# Patient Record
Sex: Male | Born: 1990 | Race: White | Hispanic: No | Marital: Married | Smoking: Never smoker
Health system: Southern US, Community
[De-identification: ages and names within clinical notes are randomized; demographics above are authoritative.]

## PROBLEM LIST (undated history)

## (undated) DIAGNOSIS — K5090 Crohn's disease, unspecified, without complications: Secondary | ICD-10-CM

---
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
## 2019-05-15 ENCOUNTER — Emergency Department
Admission: EM | Admit: 2019-05-15 | Discharge: 2019-05-15 | Disposition: A | Discharge status: Home / Self Care | Payer: Self-pay | Source: Home / Self Care | Attending: Family Medicine | Admitting: Family Medicine

## 2019-05-15 ENCOUNTER — Other Ambulatory Visit: Payer: Self-pay

## 2019-05-15 DIAGNOSIS — Z87820 Personal history of traumatic brain injury: Secondary | ICD-10-CM

## 2019-05-15 DIAGNOSIS — J3089 Other allergic rhinitis: Secondary | ICD-10-CM

## 2019-05-15 HISTORY — DX: Crohn's disease, unspecified, without complications: K50.90

## 2019-05-15 MED ORDER — PREDNISONE 20 MG PO TABS: ORAL_TABLET | ORAL | 0 refills | Status: DC

## 2019-05-15 NOTE — Discharge Instructions (Signed)
Try Pseudoephedrine (61m, one or two every 4 to 6 hours) for sinus congestion.  Get adequate rest.   May use Afrin nasal spray (or generic oxymetazoline) each morning for about 5 days and then discontinue.  Also recommend using saline nasal spray several times daily and saline nasal irrigation (AYR is a common brand).  Use Flonase nasal spray each morning after using Afrin nasal spray and saline nasal irrigation.

## 2019-05-15 NOTE — ED Triage Notes (Signed)
Dec 10th at work, a Designer, television/film set swung shut and hit in the head.  Next day had sinus issues, facial pain.  Used clean water, did not boil water.  Sat and Sun, rested felt ok.   Pt has had a ringing in the ear for several years, the ringing is worse.  Pt has had some vertigo around the 18th.  The 18th, felt "like his head was in the clouds".  Took mucinex dm, and had diarrhea.  Did a virtual visit, and was told to go to the ER.  Wed, 23rd, went to the ER, and PA felt that he did not have a concussion.  Had a flu and COVID, but both came back negative on the 25th.  25th and 26th felt better.  Yesterday played paintball, and felt good.  Last night had pressure in head, temples and forehead.

## 2019-05-15 NOTE — ED Provider Notes (Signed)
[facility]    CSN: [id number] Arrival date & time: 05/15/19  1249      History   Chief Complaint Chief Complaint  Patient presents with  . Dizziness    HPI Phillip Mcclain is a 28 y.o. male.   18 days ago a car door his patient's head.  He denies loss of consciousness, laceration and hematoma.  The next day he had increased sinus congestion and facial pain.  He has a history of tinnitus which has become worse.  About 2 weeks ago after playing paintball, he felt like his head was "in the clouds."  He was evaluated in the ER 5 days ago with a benign exam.  There flu and COVID19 tests were negative.  Yesterday he played paintball again.  Last night he felt pressure-like sensation in his head, temples, and forehead. He has a history of perennial rhinitis and chronic nasal congestion.  The history is provided by the patient.    Past Medical History:  Diagnosis Date  . Crohn's disease ([place])     There are no problems to display for this patient.   History reviewed. No pertinent surgical history.     Home Medications    Prior to Admission medications   Medication Sig Start Date End Date Taking? Authorizing Provider  Adalimumab 40 MG/0.8ML PNKT INJECT 40MG SUBCUTANEOUSLY  EVERY 2 WEEKS 06/02/18  Yes [provider]  predniSONE (DELTASONE) 20 MG tablet Take one tab by mouth twice daily for 4 days, then one daily. Take with food. 05/15/19   Kandra Nicolas, MD    Family History Family History  Problem Relation Age of Onset  . Hypertension Father     Social History Social History   Tobacco Use  . Smoking status: Never Smoker  . Smokeless tobacco: Never Used  Substance Use Topics  . Alcohol use: Not Currently  . Drug use: Not Currently     Allergies   Penicillins   Review of Systems Review of Systems  Constitutional: Negative for activity change, appetite change, chills, diaphoresis, fatigue and fever.  HENT: Positive for congestion, sinus  pressure and tinnitus. Negative for ear discharge, ear pain, facial swelling, hearing loss and sore throat.   Eyes: Negative.   Respiratory: Negative.   Cardiovascular: Negative.   Gastrointestinal: Negative.   Genitourinary: Negative.   Musculoskeletal: Negative.   Neurological: Negative for dizziness, tremors, seizures, syncope, facial asymmetry, speech difficulty, weakness, light-headedness, numbness and headaches.     Physical Exam Triage Vital Signs ED Triage Vitals  Enc Vitals Group     BP 05/15/19 1325 (!) 137/93     Pulse Rate 05/15/19 1325 95     Resp 05/15/19 1325 20     Temp 05/15/19 1325 98.5 F (36.9 C)     Temp Source 05/15/19 1325 Oral     SpO2 05/15/19 1325 100 %     Weight 05/15/19 1326 108 lb (49 kg)     Height 05/15/19 1326 5' 5"  (1.651 m)     Head Circumference --      Peak Flow --      Pain Score 05/15/19 1326 0     Pain Loc --      Pain Edu? --      Excl. in [place]? --    No data found.  Updated Vital Signs BP (!) 137/93 (BP Location: Right Arm)   Pulse 95   Temp 98.5 F (36.9 C) (Oral)   Resp 20   Ht  5' 5"  (1.651 m)   Wt 49 kg   SpO2 100%   BMI 17.97 kg/m   Visual Acuity Right Eye Distance:   Left Eye Distance:   Bilateral Distance:    Right Eye Near:   Left Eye Near:    Bilateral Near:     Physical Exam Nursing notes and Vital Signs reviewed. Appearance:  Patient appears stated age, and in no acute distress Eyes:  Pupils are equal, round, and reactive to light and accomodation.  Extraocular movement is intact.  Conjunctivae are not inflamed.  Fundi benign.  Ears:  Canals normal.  Tympanic membranes normal.  Nose:  Congested turbinates.  No sinus tenderness.   Pharynx:  Normal Neck:  Supple.  Lungs:  Clear to auscultation.  Breath sounds are equal.  Moving air well. Heart:  Regular rate and rhythm without murmurs, rubs, or gallops.  Abdomen:  Nontender without masses or hepatosplenomegaly.  Bowel sounds are present.  No CVA or flank  tenderness.  Extremities:  No edema.  Skin:  No rash present.  Neurologic:  Cranial nerves 2 through 12 are normal.  Patellar, achilles, and elbow reflexes are normal.  Cerebellar function is intact (finger-to-nose and rapid alternating hand movement).  Gait and station are normal.  Grip strength symmetric bilaterally.  Romberg negative.   UC Treatments / Results  Labs (all labs ordered are listed, but only abnormal results are displayed) Labs Reviewed - No data to display  EKG   Radiology No results found.  Procedures Procedures (including critical care time)  Medications Ordered in UC Medications - No data to display  Initial Impression / Assessment and Plan / UC Course  I have reviewed the triage vital signs and the nursing notes.  Pertinent labs & imaging results that were available during my care of the patient were reviewed by me and considered in my medical decision making (see chart for details).    Suspect mild post-concussion syndrome.  Normal neurologic exam reassuring. For sinus congestion, begin prednisone burst/taper. Followup with ENT if not improving.   Final Clinical Impressions(s) / UC Diagnoses   Final diagnoses:  History of closed head injury  Perennial allergic rhinitis     Discharge Instructions     Try Pseudoephedrine (46m, one or two every 4 to 6 hours) for sinus congestion.  Get adequate rest.   May use Afrin nasal spray (or generic oxymetazoline) each morning for about 5 days and then discontinue.  Also recommend using saline nasal spray several times daily and saline nasal irrigation (AYR is a common brand).  Use Flonase nasal spray each morning after using Afrin nasal spray and saline nasal irrigation.      ED Prescriptions    Medication Sig Dispense Auth. Provider   predniSONE (DELTASONE) 20 MG tablet Take one tab by mouth twice daily for 4 days, then one daily. Take with food. 12 tablet BKandra Nicolas MD        BKandra Nicolas  MD 05/19/19 [id number]

## 2019-05-30 ENCOUNTER — Other Ambulatory Visit: Payer: Self-pay

## 2019-05-31 ENCOUNTER — Encounter: Payer: Self-pay | Admitting: Family Medicine

## 2019-05-31 ENCOUNTER — Ambulatory Visit (INDEPENDENT_AMBULATORY_CARE_PROVIDER_SITE_OTHER): Payer: Self-pay | Admitting: Family Medicine

## 2019-05-31 ENCOUNTER — Ambulatory Visit: Payer: Self-pay | Admitting: Family Medicine

## 2019-05-31 ENCOUNTER — Other Ambulatory Visit: Payer: Self-pay

## 2019-05-31 VITALS — BP 118/80 | HR 56 | Temp 97.2°F | Ht 65.0 in | Wt 105.5 lb

## 2019-05-31 DIAGNOSIS — S0990XA Unspecified injury of head, initial encounter: Secondary | ICD-10-CM

## 2019-05-31 DIAGNOSIS — R42 Dizziness and giddiness: Secondary | ICD-10-CM

## 2019-05-31 DIAGNOSIS — H6982 Other specified disorders of Eustachian tube, left ear: Secondary | ICD-10-CM

## 2019-05-31 NOTE — Patient Instructions (Signed)
Fish oil 3 grams daily for 10 days then 2 grams daily  Vitamin D 4000 IU daily  CoQ10 228m daily for headaches  Tart cherry extract any dose at night  Keep the diet clean and stay active.  Send me a message in 1-2 days if you would like to see a neurologist.   I think you will feel quite a bit better after starting a nasal spray.  Claritin (loratadine), Allegra (fexofenadine), Zyrtec (cetirizine) which is also equivalent to Xyzal (levocetirizine); these are listed in order from weakest to strongest. Generic, and therefore cheaper, options are in the parentheses.   Flonase (fluticasone); nasal spray that is over the counter. 2 sprays each nostril, once daily. Aim towards the same side eye when you spray.  There are available OTC, and the generic versions, which may be cheaper, are in parentheses. Show this to a pharmacist if you have trouble finding any of these items.  Let [place] if you need anything.

## 2019-05-31 NOTE — Progress Notes (Signed)
Chief Complaint  Patient presents with  . New Patient (Initial Visit)    04/27/19 hit in head by car door possible a concusion       New Patient Visit SUBJECTIVE: HPI: Phillip Mcclain is an 29 y.o.male who is being seen for establishing care.  The patient has not had PCP.  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  On 12/10, he got hit in back of head w a truck door.  It was not particularly hard, did not have LOC.  Since that time, the ringing in his left ear has gotten louder.  He will have some fullness on that side as well.  He has had some sinus issues after this happened initially.  He has been doing sinus rinses but no other medications.  He was seen and turned away by several urgent cares before having a virtual visit and being told he needs to go to the emergency department.  Work-up was unremarkable and he was discharged home.  He had another episode of vertigo and went to the ED 5 days later.  Visit was unremarkable, tested negative for both flu and Covid.  Symptoms are overall improved but he still having pressure over his temples, the ear issue, neck tightness, and intermittent vertigo (lasting around several minutes).  It does not calm when he is physically active or concentrating but usually after.  He did try the Epley maneuver which was not particularly helpful.  Past Medical History:  Diagnosis Date  . Crohn's disease ([place])    History reviewed. No pertinent surgical history.   Family History  Problem Relation Age of Onset  . Hypertension Father    Allergies  Allergen Reactions  . Penicillins     Current Outpatient Medications:  .  Adalimumab 40 MG/0.8ML PNKT, INJECT 40MG SUBCUTANEOUSLY  EVERY 2 WEEKS, Disp: , Rfl:   ROS MSK: +neck tightness Neuro: +intermittent vertigo  OBJECTIVE: BP 118/80 (BP Location: Left Arm, Patient Position: Sitting, Cuff Size: Normal)    Pulse (!) 56   Temp (!) 97.2 F (36.2 C) (Temporal)   Ht 5' 5"  (1.651 m)   Wt 105 lb 8 oz (47.9 kg)   SpO2 95%   BMI 17.56 kg/m  General:  well developed, well nourished, in no apparent distress Skin:  no significant moles, warts, or growths Nose:  nares patent, septum midline, mucosa normal Ears: Canals are patent bilaterally without lesions; TMs negative on the right, retracted on the left without fluid or erythema. Throat/Pharynx:  lips and gingiva without lesion; tongue and uvula midline; non-inflamed pharynx; no exudates, +postnasal drainage Lungs:  clear to auscultation, breath sounds equal bilaterally, no respiratory distress Cardio:  regular rate and rhythm, no LE edema or bruits Musculoskeletal:  symmetrical muscle groups noted without atrophy or deformity Neuro:  gait normal, DTRs equal and symmetric throughout, no clonus, no cerebellar signs Psych: well oriented with normal range of affect and appropriate judgment/insight  ASSESSMENT/PLAN: Dysfunction of left eustachian tube  Vertigo  Injury of head, initial encounter  1-start INCS and PO antihistamine.  He reports he is very sensitive to medication so we will hold off on prednisone.  He has an appointment with the ENT team tomorrow. 2-I think it could be related to his ear issue and if the ETD is fixed, I think his vertigo will be as well. 3-while I think this could be related to a postconcussive  symptom, it is not fit classic signs and symptoms, particular exacerbation with concentration or physical exertion.  I did give him some over-the-counter supplements such as vitamin D, coenzyme Q 10, cherry tart extract, and fish oil to take.  The downside of these is low. Offered to refer to Neuro if he is not pleased w ENT eval and is not improving w INCS and supplements.  Follow-up in 6 months for physical or as needed. The patient voiced understanding and agreement to the plan.   Nixon, DO 05/31/19  11:12  AM

## 2019-06-07 ENCOUNTER — Ambulatory Visit: Payer: Self-pay | Admitting: Family Medicine

## 2019-06-07 ENCOUNTER — Encounter: Payer: Self-pay | Admitting: Family Medicine

## 2019-06-07 ENCOUNTER — Other Ambulatory Visit: Payer: Self-pay | Admitting: Family Medicine

## 2019-06-07 DIAGNOSIS — S060X0D Concussion without loss of consciousness, subsequent encounter: Secondary | ICD-10-CM

## 2019-06-07 DIAGNOSIS — F0781 Postconcussional syndrome: Secondary | ICD-10-CM

## 2019-06-07 NOTE — Progress Notes (Signed)
am

## 2019-07-04 ENCOUNTER — Other Ambulatory Visit: Payer: Self-pay

## 2019-07-05 ENCOUNTER — Telehealth: Payer: Self-pay

## 2019-07-05 ENCOUNTER — Encounter: Payer: Self-pay | Admitting: Family Medicine

## 2019-07-05 ENCOUNTER — Ambulatory Visit (INDEPENDENT_AMBULATORY_CARE_PROVIDER_SITE_OTHER): Payer: 59 | Admitting: Family Medicine

## 2019-07-05 VITALS — BP 108/62 | HR 102 | Temp 96.7°F | Ht 65.0 in | Wt 111.4 lb

## 2019-07-05 DIAGNOSIS — R42 Dizziness and giddiness: Secondary | ICD-10-CM

## 2019-07-05 DIAGNOSIS — S0990XD Unspecified injury of head, subsequent encounter: Secondary | ICD-10-CM | POA: Diagnosis not present

## 2019-07-05 NOTE — Telephone Encounter (Signed)
Patient was hit in head by a car door on 04/26/2020. No LOC. One other head injury as a child. Patient has been having pressure in his head, dizziness, and tinnitus intermittently with no pattern to any of the symptoms. Also feels anxious when he gets these symptoms. 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 Work does not provoke symptoms. Also plays competitive paintball. No symptoms with activity but develops symptoms afterwards. Is seeing neurology next week but would also like to be seen in our clinic.

## 2019-07-05 NOTE — Progress Notes (Signed)
Chief Complaint  Patient presents with  . Concussion  . Blood Sugar Problem    Subjective: Patient is a 29 y.o. male here for f/u.  The patient sustained a head injury several months ago.  He thought he had a concussion and is having issues with postconcussive syndrome.  He has an appointment with the neurology team next week.  He has been taking supplements such as fish oil, vitamin D, coenzyme Q 10, and chart cherry extract with minimal improvement.  He has been having vague neurologic complaints.  He saw a different neurologist 1 week ago and was told to drink more water.  He is currently drinking 60-65 ounces daily.  Since that time, he is also felt more lightheadedness/dizziness the further he gets from meals.  This would happen to a lesser extent when he was younger.  He does have a family history of diabetes but no history of high insulin or frequent low blood sugars.  He takes no medications routinely outside of supplements.  His diet remains largely unchanged.   ROS: Endo: No wt loss Cardiac: +lightheadedness  Past Medical History:  Diagnosis Date  . Crohn's disease ([place])     Objective: BP 108/62 (BP Location: Left Arm, Patient Position: Sitting, Cuff Size: Normal)   Pulse (!) 102   Temp (!) 96.7 F (35.9 C) (Temporal)   Ht 5' 5"  (1.651 m)   Wt 111 lb 6 oz (50.5 kg)   SpO2 94%   BMI 18.53 kg/m  General: Awake, appears stated age HEENT: MMM, EOMi Heart: RRR, no murmurs, no bruits Lungs: CTAB, no rales, wheezes or rhonchi. No accessory muscle use Neuro: DTRs equal and symmetric throughout, no clonus, no cerebellar signs, 5/5 strength throughout, gait is normal Psych: Age appropriate judgment and insight, normal affect and mood  Assessment and Plan: Dizziness - Plan: Insulin and C-Peptide, Proinsulin, Hemoglobin B9U, Basic metabolic panel  Injury of head, subsequent encounter  For his longer standing issue I will defer to the neurology team.  He agrees with this.  He  may have anxiety that is exacerbating his symptoms.  He is admittedly an anxious person.  As he has not noticed any improvement with the supplements, I recommended he stop. For the lightheadedness, it does sound like he is having hypoglycemia.  I will check the above labs to rule this in or out.  May need to get an MRI to rule out insulinoma.  Symptoms are definitely not postprandial. Pending the above work-up, I will see him as originally scheduled for his physical. The patient voiced understanding and agreement to the plan.  Stewart, DO 07/05/19  8:23 PM

## 2019-07-05 NOTE — Patient Instructions (Signed)
Let's see what Dr. Tomi Likens says.  Give me around 1 week to get your labs back once they are drawn. Please fast.  Let us know if you need anything.

## 2019-07-10 NOTE — Progress Notes (Signed)
NEUROLOGY CONSULTATION NOTE  Phillip Mcclain MRN: [id number] DOB: March 10, 1991  Referring provider: Riki Sheer, DO Primary care provider: Riki Sheer, DO  Reason for consult:  Postconcussion syndrome  HISTORY OF PRESENT ILLNESS: Phillip Mcclain is a 29 year old right-handed male with Crohn's disease who presents for postconcussion syndrome.  History supplemented by Urgent Care, prior neurologist's and referring provider's notes.  In December 2020, he was standing by his truck on an incline with the door open.  When he bent down to pick something off the ground, the door began to close and hit him in the back of his head on the right side.  He did not lose consciousness.  Overall, he felt fine afterwards.  That night, he leaned his head back and developed episodic vertigo.  The next morning, he woke up with one of his "sinus headaches" which was a mild throbbing pressure across his forehead and around his eyes.  He played competitive paintball that day and began having worsening head pressure and dizziness.  He has chronic ringing in his left ear which seemed to become worse.  When he would drive, he noted worsening head pressure and difficulty concentrating.  It started to become uncomfortable wearing his contact lenses, so he started wearing his glasses.  He then had to not wear any lenses due to discomfort.  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  About 2 weeks later, on 05/10/2019, he went to Urgent Care for further evaluation.  He underwent viral testing for Covid and the flu, which were negative.  He started feeling spasms at the base of his skull for a couple of seconds at a time and then developed some bilateral paraspinal soreness.  Head pressure mostly resolved but then he started experiencing tingling across his forehead and in a bandlike distribution.  He has since lightly bumped his head a few times and notes that those areas are sore and tender to touch.   Initially, he had reduced appetite.  Now he still has nausea but appetite is improved.  He reports increased anxiety.  Over the past 2 weeks, he has had episodes of lightheadedness, jitteriness, diaphoresis, tingling in his legs and fluctuations of hot and cold sensation.  He feels like he is going to pass out.  He was evaluated by ENT who did not find any evidence of BPPV or other inner ear pathology.  He was evaluated by a neurologist at [facility] who encouraged him to increase his water intake and take Aleve as needed.  He is scheduled for an appointment at the [facility] tomorrow.  PAST MEDICAL HISTORY: Past Medical History:  Diagnosis Date  . Crohn's disease ([place])     PAST SURGICAL HISTORY: No past surgical history on file.  MEDICATIONS: Current Outpatient Medications on File Prior to Visit  Medication Sig Dispense Refill  . Adalimumab 40 MG/0.8ML PNKT INJECT 40MG SUBCUTANEOUSLY  EVERY 2 WEEKS    . azaTHIOprine (IMURAN) 50 MG tablet Take by mouth.     No current facility-administered medications on file prior to visit.    ALLERGIES: Allergies  Allergen Reactions  . Penicillins     FAMILY HISTORY: Family History  Problem Relation Age of Onset  . Hypertension Father     SOCIAL HISTORY: Social History   Socioeconomic History  . Marital status: Married    Spouse name: Not on file  . Number of children: Not on file  . Years of education: Not on  file  . Highest education level: Not on file  Occupational History  . Not on file  Tobacco Use  . Smoking status: Never Smoker  . Smokeless tobacco: Never Used  Substance and Sexual Activity  . Alcohol use: Not Currently  . Drug use: Not Currently  . Sexual activity: Not on file  Other Topics Concern  . Not on file  Social History Narrative  . Not on file   Social Determinants of Health   Financial Resource Strain:   . Difficulty of Paying Living Expenses: Not on file  Food Insecurity:   . Worried  About Charity fundraiser in the Last Year: Not on file  . Ran Out of Food in the Last Year: Not on file  Transportation Needs:   . Lack of Transportation (Medical): Not on file  . Lack of Transportation (Non-Medical): Not on file  Physical Activity:   . Days of Exercise per Week: Not on file  . Minutes of Exercise per Session: Not on file  Stress:   . Feeling of Stress : Not on file  Social Connections:   . Frequency of Communication with Friends and Family: Not on file  . Frequency of Social Gatherings with Friends and Family: Not on file  . Attends Religious Services: Not on file  . Active Member of Clubs or Organizations: Not on file  . Attends Archivist Meetings: Not on file  . Marital Status: Not on file  Intimate Partner Violence:   . Fear of Current or Ex-Partner: Not on file  . Emotionally Abused: Not on file  . Physically Abused: Not on file  . Sexually Abused: Not on file    PHYSICAL EXAM: Blood pressure (!) 145/90, pulse (!) 114, resp. rate 18, height 5' 5"  (1.651 m), weight 113 lb (51.3 kg), SpO2 99 %. General: No acute distress.  Patient appears well-groomed.  Head:  Normocephalic/atraumatic Eyes:  fundi examined but not visualized Neck: supple, mild paraspinal tenderness, full range of motion Back: No paraspinal tenderness Heart: regular rate and rhythm Lungs: Clear to auscultation bilaterally. Vascular: No carotid bruits. Neurological Exam: Mental status: alert and oriented to person, place, and time, recent and remote memory intact, fund of knowledge intact, attention and concentration intact, speech fluent and not dysarthric, language intact. Cranial nerves: CN I: not tested CN II: pupils equal, round and reactive to light, visual fields intact CN III, IV, VI:  full range of motion, no nystagmus, no ptosis CN V: facial sensation intact CN VII: upper and lower face symmetric CN VIII: hearing intact CN IX, X: gag intact, uvula midline CN XI:  sternocleidomastoid and trapezius muscles intact CN XII: tongue midline Bulk & Tone: normal, no fasciculations. Motor:  5/5 throughout  Sensation:  Pinprick and vibration sensation intact. Deep Tendon Reflexes:  2+ throughout, toes downgoing.  Finger to nose testing:  Without dysmetria.  Heel to shin:  Without dysmetria.  Gait:  Normal station and stride.  Able to turn and tandem walk. Romberg negative.  IMPRESSION: Postconcussion syndrome.    PLAN: 1.  Given the persistence of various symptomatology, we will check MRI of brain.   2.  He will be evaluated at the concussion clinic for further recommendations of treatment (such as any kind of rehab). 3.  Provided him with information regarding vitamins and supplements that may be helpful:  Turmeric, alpha-lipoic acid, melatonin, omega-3/fish oil. 4.  Follow up in 4 months.  Thank you for allowing me to take part in  the care of this patient.  Metta Clines, DO  CC:  Riki Sheer, DO

## 2019-07-11 ENCOUNTER — Other Ambulatory Visit (INDEPENDENT_AMBULATORY_CARE_PROVIDER_SITE_OTHER): Payer: 59

## 2019-07-11 ENCOUNTER — Encounter: Payer: Self-pay | Admitting: Neurology

## 2019-07-11 ENCOUNTER — Other Ambulatory Visit: Payer: Self-pay

## 2019-07-11 ENCOUNTER — Ambulatory Visit (INDEPENDENT_AMBULATORY_CARE_PROVIDER_SITE_OTHER): Payer: 59 | Admitting: Neurology

## 2019-07-11 VITALS — BP 145/90 | HR 114 | Resp 18 | Ht 65.0 in | Wt 113.0 lb

## 2019-07-11 DIAGNOSIS — R42 Dizziness and giddiness: Secondary | ICD-10-CM | POA: Diagnosis not present

## 2019-07-11 DIAGNOSIS — F0781 Postconcussional syndrome: Secondary | ICD-10-CM

## 2019-07-11 LAB — BASIC METABOLIC PANEL
BUN: 15 mg/dL (ref 6–23)
CO2: 28 mEq/L (ref 19–32)
Calcium: 9.8 mg/dL (ref 8.4–10.5)
Chloride: 100 mEq/L (ref 96–112)
Creatinine, Ser: 1.02 mg/dL (ref 0.40–1.50)
GFR: 85.94 mL/min (ref >60.00)
Glucose, Bld: 119 mg/dL — ABNORMAL HIGH (ref 70–99)
Potassium: 4.1 mEq/L (ref 3.5–5.1)
Sodium: 138 mEq/L (ref 135–145)

## 2019-07-11 LAB — HEMOGLOBIN A1C: Hgb A1c MFr Bld: 5.7 % (ref 4.6–6.5)

## 2019-07-11 NOTE — Patient Instructions (Addendum)
  To help improve COGNITIVE function: Using fish oil/omega 3 that is 1000 mg (or roughly 600 mg EPA/DHA), starting as soon as possible after concussion, take: 3 tabs THREE TIMES a day  for the first 3 days, then (you will smell a little, sorry) 3 tabs TWICE DAILY  for the next 3 days, then 3 tabs ONCE DAILY  for the next 10 days    To help reduce HEADACHES: Coenzyme Q10 16m ONCE DAILY Riboflavin/Vitamin B2 4058mONCE DAILY Magnesium oxide 40031mNCE - TWICE DAILY May stop after headaches are resolved.                                                                                               To help with INSOMNIA: Melatonin 3-5mg48m BEDTIME Tart cherry extract, any dose at night    Other medicines to help decrease inflammation Alpha Lipoic Acid 100mg56mCE DAILY Turmeric 500mg 93me daily Iron 65mg e31mntal daily Vitamin D 4000 IU daily for 2 weeks then 2000 IU daily thereafter.  We will get an MRI of the brain without contrast Follow up with Dr. Corey tGeorgina Snellow at [facility] up with me in 3 to 4 months.  We have sent a referral to [facility] and they will call you directly to schedule your appointment. They are located at [address] need to contact them directly please call [phone]

## 2019-07-12 ENCOUNTER — Ambulatory Visit (INDEPENDENT_AMBULATORY_CARE_PROVIDER_SITE_OTHER): Payer: 59 | Admitting: Family Medicine

## 2019-07-12 ENCOUNTER — Encounter: Payer: Self-pay | Admitting: Family Medicine

## 2019-07-12 DIAGNOSIS — M5481 Occipital neuralgia: Secondary | ICD-10-CM | POA: Diagnosis not present

## 2019-07-12 DIAGNOSIS — F0781 Postconcussional syndrome: Secondary | ICD-10-CM | POA: Insufficient documentation

## 2019-07-12 DIAGNOSIS — S098XXD Other specified injuries of head, subsequent encounter: Secondary | ICD-10-CM

## 2019-07-12 DIAGNOSIS — G4709 Other insomnia: Secondary | ICD-10-CM | POA: Diagnosis not present

## 2019-07-12 DIAGNOSIS — G4700 Insomnia, unspecified: Secondary | ICD-10-CM | POA: Insufficient documentation

## 2019-07-12 HISTORY — DX: Postconcussional syndrome: F07.81

## 2019-07-12 LAB — INSULIN AND C-PEPTIDE, SERUM
C-Peptide: 2.7 ng/mL (ref 1.1–4.4)
INSULIN: 13.1 u[IU]/mL (ref 2.6–24.9)

## 2019-07-12 MED ORDER — TRAZODONE HCL 50 MG PO TABS: 50.0000 mg | ORAL_TABLET | Freq: Every evening | ORAL | 2 refills | Status: DC | PRN

## 2019-07-12 NOTE — Progress Notes (Signed)
Subjective:     Chief Complaint: Phillip Mcclain, LAT, ATC, am serving as scribe for Dr. [name].  Phillip Mcclain, DOB: May 22, 1990, is a 29 y.o. male who presents for possible post-concussion syndrome after suffering an injury on 04/27/19 in which he was bending down to pick something up off the ground and his truck door hit him in the back of his head.  He denies any LOC at the time of the injury.  He has been seen by multiple providers including his PCP, 2 neurologists and several ED/UC providers, most recently seeing Dr. Tomi Mcclain yesterday, 07/11/19.  Pt's main c/o at this time are vertigo, headache/pressure in his head and ringing in his L ear that was present prior to his injury but have worsened since.  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  He states that the majority of his symptoms are intermittent in nature.  He specifically reports increased difficulty w/ sleeping this week that he hasn't had in a while.  He also reports pressure in the front of his head that runs along his forehead, posterior neck pain and sub-occipital area, tingling that he feels at both his post head and forehead, intermittent dizziness and B ringing in his ears.       Injury date : 04/26/20 Visit #: 1  Previous imagine.   History of Present Illness:    Concussion Self-Reported Symptom Score Symptoms rated on a scale 1-6, in last 24 hours  Headache: 1    Nausea: 3  Vomiting: 0  Balance Difficulty: 0   Dizziness: 3  Fatigue: 3  Trouble Falling Asleep: 5   Sleep More Than Usual: 0  Sleep Less Than Usual: 5  Daytime Drowsiness: 4  Photophobia: 2  Phonophobia: 4  Irritability: 3  Sadness: 2  Nervousness: 5  Feeling More Emotional: 5  Numbness or Tingling: 4  Feeling Slowed Down: 0  Feeling Mentally Foggy: 1  Difficulty Concentrating: 1  Difficulty Remembering: 0  Visual Problems: 3  Neck Pain: Yes but not rated  Tinnitus: Yes but not  rated   Total # of Symptoms: 17/22 Total Symptom Score: 54/132   Review of Systems:  No , visual changes, nausea, vomiting, diarrhea, constipation, dizziness, abdominal pain, skin rash, fevers, chills, night sweats, weight loss, swollen lymph nodes, body aches, joint swelling, muscle aches, chest pain, shortness of breath, mood changes.   +Headache   Review of History: Past Medical History: @PMHP @  Past Surgical History:  has no past surgical history on file. Family History: family history includes Hypertension in his father. no family history of autoimmune Social History:  reports that he has never smoked. He has never used smokeless tobacco. He reports previous alcohol use. He reports previous drug use. Current Medications: has a current medication list which includes the following prescription(s): adalimumab, azathioprine, and trazodone. Allergies: is allergic to penicillins.  Objective:    Physical Examination Vitals:   07/12/19 0911  BP: 130/86  Pulse: 90  SpO2: 96%   General: No apparent distress alert and oriented x3 mood and affect normal, dressed appropriately.  HEENT: Pupils equal, extraocular movements intact  Respiratory: Patient's speak in full sentences and does not appear short of breath  Cardiovascular: No lower extremity edema, non tender, no erythema  Skin: Warm dry intact with no signs of infection or rash on extremities or on axial skeleton.  Abdomen: Soft nontender  Neuro: Cranial nerves II through  XII are intact, neurovascularly intact in all extremities with 2+ DTRs and 2+ pulses.  Lymph: No lymphadenopathy of posterior or anterior cervical chain or axillae bilaterally.  Gait normal with good balance and coordination.  MSK:  Non tender with full range of motion and good stability and symmetric strength and tone of shoulders, elbows, wrist,  knee and ankles bilaterally.  Psychiatric: Oriented X3, intact recent and remote memory, judgement and insight,  normal mood and affect  Concussion testing performed today:  I spent 20 minutes with patient discussing test and results including review of history and patient chart and  integration of patient data, interpretation of standardized test results and clinical data, clinical decision making, treatment planning and report,and interactive feedback to the patient with all of patients questions answered.    Neurocognitive testing (ImPACT):  Baseline:    Verbal Memory Composite 83 (44%)    Visual Memory Composite 66 (25%)    Visual Motor Speed Composite 35.27 (25%)    Reaction Time Composite 0.76 (2%)    Cognitive Efficiency Index 0.11     Vestibular Screening:   Pre VOMS  HA Score: 1 Pre VOMS  Dizziness Score: 3   Headache  Dizziness  Smooth Pursuits 0 1  H. Saccades 2 2  V. Saccades 2 2  H. VOR 0 0  V. VOR 0 0  Visual Motor Sensitivity 0 1      Convergence: 0 cm  0 1   Balance Screen: Normal bilateral stance.  Impaired single-leg.  Normal tandem stance.     Assessment and plan   Post concussion syndrome - Plan: Ambulatory referral to Physical Therapy, CANCELED: Ambulatory referral to Physical Therapy  Other insomnia - Plan: Ambulatory referral to Physical Therapy, CANCELED: Ambulatory referral to Physical Therapy  Bilateral occipital neuralgia - Plan: Ambulatory referral to Physical Therapy, CANCELED: Ambulatory referral to Physical Therapy  Phillip Mcclain presents with the following concussion subtypes. [x] Cognitive [x] Cervical [x] Vestibular [] Ocular [] Migraine [x] Anxiety/Mood  \ Patient has significant symptoms over 2 months following concussion.  At this point he has postconcussion syndrome.  He is insomnia and anxiety components additionally he has posterior neck pain and headache and symptoms consistent with occipital neuralgia and he has some vestibular and cognitive effects as well. At this point I think the rest phase of concussion rehab is over and we should proceed  to active treatment.  Agree with brain MRI as arranged by neurology yesterday. Plan to proceed with referral to physical therapy for management of posterior neck pain and occipital neuralgia as well as some vestibular rehab.  Additionally will use trazodone at bedtime for insomnia.  Will consider other antianxiety medication such as SSRIs or SNRIs in the near future if not well controlled.  Additionally recommend return to exercise protocol and will recheck in about a month.     After Visit Summary printed out and provided to patient as appropriate.  Total encounter time 40 minutes including charting time date of service.

## 2019-07-12 NOTE — Patient Instructions (Addendum)
Thank you for coming in today. You should hear about PT soon.  Try trazodone at bedtime as needed.  Recheck with me in about 1 month.  Return or contact me sooner if not doing well.    Occipital Neuralgia  Occipital neuralgia is a type of headache that causes brief episodes of very bad pain in the back of your head. Pain from occipital neuralgia may spread (radiate) to other parts of your head. These headaches may be caused by irritation of the nerves that leave your spinal cord high up in your neck, just below the base of your skull (occipital nerves). Your occipital nerves transmit sensations from the back of your head, the top of your head, and the areas behind your ears. What are the causes? This condition can occur without any known cause (primary headache syndrome). In other cases, this condition is caused by pressure on or irritation of one of the two occipital nerves. Pressure and irritation may be due to:  Muscle spasm in the neck.  Neck injury.  Wear and tear of the vertebrae in the neck (osteoarthritis).  Disease of the disks that separate the vertebrae.  Swollen blood vessels that put pressure on the occipital nerves.  Infections.  Tumors.  Diabetes. What are the signs or symptoms? This condition causes brief burning, stabbing, electric, shocking, or shooting pain which can radiate to the top of the head. It can happen on one side or both sides of the head. It can also cause:  Pain behind the eye.  Pain triggered by neck movement or hair brushing.  Scalp tenderness.  Aching in the back of the head between episodes of very bad pain.  Pain gets worse with exposure to bright lights. How is this diagnosed? There is no test that diagnoses this condition. Your health care provider may diagnose this condition based on a physical exam and your symptoms. Other tests may be done, such as:  Imaging studies of the brain and neck (cervical spine), such as an MRI or CT scan.  These look for causes of pinched nerves.  Applying pressure to the nerves in the neck to try to re-create the pain.  Injection of numbing medicine into the occipital nerve areas to see if pain goes away (diagnostic nerve block). How is this treated? Treatment for this condition may begin with simple measures, such as:  Rest.  Massage.  Applying heat or cold on the area.  Over-the-counter pain relievers. If these measures do not work, you may need other treatments, including:  Medicines, such as: ? Prescription-strength anti-inflammatory medicines. ? Muscle relaxants. ? Anti-seizure medicines, which can relieve pain. ? Antidepressants, which can relieve pain. ? Injected medicines, such as medicines that numb the area (local anesthetic) and steroids.  Pulsed radiofrequency ablation. This is when wires are implanted to deliver electrical impulses that block pain signals from the occipital nerve.  Surgery to relieve nerve pressure.  Physical therapy. Follow these instructions at home: Pain management      Avoid any activities that cause pain.  Rest when you have an attack of pain.  Try gentle massage to relieve pain.  Try a different pillow or sleeping position.  If directed, apply heat to the affected area as told by your health care provider. Use the heat source that your health care provider recommends, such as a moist heat pack or a heating pad. ? Place a towel between your skin and the heat source. ? Leave the heat on for 20-30 minutes. ?  Remove the heat if your skin turns bright red. This is especially important if you are unable to feel pain, heat, or cold. You may have a greater risk of getting burned.  If directed, apply ice to the back of the head and neck area as told by your health care provider. ? Put ice in a plastic bag. ? Place a towel between your skin and the bag. ? Leave the ice on for 20 minutes, 2-3 times per day. General instructions  Take  over-the-counter and prescription medicines only as told by your health care provider.  Avoid things that make your symptoms worse, such as bright lights.  Try to stay active. Get regular exercise that does not cause pain. Ask your health care provider to suggest safe exercises for you.  Work with a physical therapist to learn stretching exercises you can do at home.  Practice good posture.  Keep all follow-up visits as told by your health care provider. This is important. Contact a health care provider if:  Your medicine is not working.  You have new or worsening symptoms. Get help right away if:  You have very bad head pain that does not go away.  You have a sudden change in vision, balance, or speech. Summary  Occipital neuralgia is a type of headache that causes brief episodes of very bad pain in the back of your head.  Pain from occipital neuralgia may spread (radiate) to other parts of your head.  Treatment for this condition includes rest, massage, and medicines. This information is not intended to replace advice given to you by your health care provider. Make sure you discuss any questions you have with your health care provider. Document Revised: 04/20/2017 Document Reviewed: 07/09/2016 Elsevier Patient Education  [place].   Post-Concussion Syndrome  Post-concussion syndrome is when symptoms last longer than normal after a head injury. What are the signs or symptoms? After a head injury, you may:  Have headaches.  Feel tired.  Feel dizzy.  Feel weak.  Have trouble seeing.  Have trouble in bright lights.  Have trouble hearing.  Not be able to remember things.  Not be able to focus.  Have trouble sleeping.  Have mood swings.  Have trouble learning new things. These can last from weeks to months. Follow these instructions at home: Medicines  Take all medicines only as told by your doctor.  Do not take prescription pain  medicines. Activity  Limit activities as told by your doctor. This includes: ? Homework. ? Job-related work. ? Thinking. ? Watching TV. ? Using a computer or phone. ? Puzzles. ? Exercise. ? Sports.  Slowly return to your normal activity as told by your doctor.  Stop an activity if you have symptoms.  Do not do anything that may cause you to get injured again. General instructions  Rest. Try to: ? Sleep 7-9 hours each night. ? Take naps or breaks when you feel tired during the day.  Do not drink alcohol until your doctor says that you can.  Keep track of your symptoms.  Keep all follow-up visits as told by your doctor. This is important. Contact a doctor if:  You do not improve.  You get worse.  You have another injury. Get help right away if:  You have a very bad headache.  You feel confused.  You feel very sleepy.  You pass out (faint).  You throw up (vomit).  You feel weak in any part of your body.  You feel numb in any part of your body.  You start shaking (have a seizure).  You have trouble talking. Summary  Post-concussion syndrome is when symptoms last longer than normal after a head injury.  Limit all activity after your injury. Gradually return to normal activity as told by your doctor.  Rest, do not drink alcohol, and avoid prescription pain medicines after a concussion.  Call your doctor if your symptoms get worse. This information is not intended to replace advice given to you by your health care provider. Make sure you discuss any questions you have with your health care provider. Document Revised: 02/24/2018 Document Reviewed: 06/08/2017 Elsevier Patient Education  2020 [employer].

## 2019-07-13 ENCOUNTER — Encounter: Payer: Self-pay | Admitting: Family Medicine

## 2019-07-20 LAB — PROINSULIN: Proinsulin: 4.1 pmol/L

## 2019-07-21 ENCOUNTER — Encounter: Payer: Self-pay | Admitting: Rehabilitative and Restorative Service Providers"

## 2019-07-21 ENCOUNTER — Ambulatory Visit (INDEPENDENT_AMBULATORY_CARE_PROVIDER_SITE_OTHER): Payer: 59 | Admitting: Rehabilitative and Restorative Service Providers"

## 2019-07-21 ENCOUNTER — Other Ambulatory Visit: Payer: Self-pay

## 2019-07-21 DIAGNOSIS — R2689 Other abnormalities of gait and mobility: Secondary | ICD-10-CM

## 2019-07-21 DIAGNOSIS — H8111 Benign paroxysmal vertigo, right ear: Secondary | ICD-10-CM | POA: Diagnosis not present

## 2019-07-21 DIAGNOSIS — R42 Dizziness and giddiness: Secondary | ICD-10-CM | POA: Diagnosis not present

## 2019-07-21 NOTE — Therapy (Signed)
[facility] [place], [place], [address] Phone: [phone]   Fax:  [phone]  Physical Therapy Evaluation  Patient Details  Name: Phillip Mcclain MRN: [id number] Date of Birth: 06-Mar-1991 Referring Provider (PT): [name], MD   Encounter Date: 07/21/2019  PT End of Session - 07/21/19 1713    Visit Number  1    Number of Visits  6    Date for PT Re-Evaluation  09/01/19    PT Start Time  1020    PT Stop Time  1105    PT Time Calculation (min)  45 min       Past Medical History:  Diagnosis Date  . Crohn's disease ([place])   . Crohn's disease of intestine ([place]) 02/24/2005    History reviewed. No pertinent surgical history.  There were no vitals filed for this visit.   Subjective Assessment - 07/21/19 1021    Subjective  The patient got hit in the head with a car door on April 27, 2019.  He had initial pain, but no immediate symptoms.  The day after, he had pressure n his head.  He played paint ball that Sunday and didn't have any issues except for tinnitus in the left ear.  The next week while looking at his phone, he noticed mild sense of dizziness.  The following week, he noticed difficulty driving, head pressure, and went to ER.  He noticed he got increased anxiety, nausea, and dizziness.  He saw ENT and was negative for BPPV, and saw neurologist that dx post concussive syndrome.  He saw Dr. Georgina Snell last week and felt this was the start of improvement as he understood his symptoms more.  Sleep is improved since visit with Dr. Georgina Snell last week.    Patient Stated Goals  Return to prior activities without symptoms    Currently in Pain?  Yes    Pain Score  --   ear tinnitus and sensations of temporal HA mild in nature; notes stiffnes in neck or annoyance, but doesn't typically feel this   Pain Onset  More than a month ago    Pain Frequency  Intermittent    Aggravating Factors   visual exercises worsen temporal HA     Pain Relieving Factors  rest    Effect of Pain on Daily Activities  ranges from 65-80% of prior level of function.         [facility] PT Assessment - 07/21/19 1657      Assessment   Medical Diagnosis  post concussion syndrome    Referring Provider (PT)  [name], MD    Onset Date/Surgical Date  04/27/19    Prior Therapy  none      Precautions   Precautions  Other (comment)    Precaution Comments  discussed slow return to competitive paint ball, beginning with light jogging and more one dimensional tasks      Restrictions   Weight Bearing Restrictions  No      Balance Screen   Has the patient fallen in the past 6 months  No    Has the patient had a decrease in activity level because of a fear of falling?   No    Is the patient reluctant to leave their home because of a fear of falling?   No      Home Film/video editor residence    Living Arrangements  Spouse/significant other      Prior  Function   Level of Independence  Independent    Vocation  Full time employment    Technical brewer-- crawls into attic spaces, tight spaces at times    Leisure  competitive Building control surveyor, trains with running, etc      Cognition   Overall Cognitive Status  Impaired/Different from baseline   *see IMPACT testing from MD visit     Sensation   Light Touch  Impaired Detail   some episodes of hand tingling     ROM / Strength   AROM / PROM / Strength  AROM      AROM   Overall AROM Comments  WNLs c-spine for seated rotation; *PT to fully assess joint mobility next visit      Flexibility   Soft Tissue Assessment /Muscle Length  --   notes some muscle tightness neck during VOR     Ambulation/Gait   Ambulation/Gait  Yes    Ambulation/Gait Assistance  7: Independent      Balance   Balance Assessed  Yes      Dynamic Standing Balance   Dynamic Standing - Comments  Components of BESS test performed.  Patient able to maintain standing firm surface +  FT + EC x 20 seconds with 2 erros, single leg stance + EC on R leg (dominant side) -- had >6 errors beginning to place foot down at 4 seconds and not being able to hold, tandem + EC 4 errors= total firm surface errors = 12;  PT to complete foam surface testing components.            Vestibular Assessment - 07/21/19 1707      Vestibular Assessment   General Observation  Notes mild dizziness at times when waking up in middle of night. Notes occasional "fogginess" as hard to focus visually when in busy environments.      Symptom Behavior   Subjective history of current problem  began days post injury    History of similar episodes  no prior episodes      Oculomotor Exam   Oculomotor Alignment  Normal    Ocular ROM  within full limits    Spontaneous  Absent    Gaze-induced   Absent    Smooth Pursuits  Intact    Saccades  Intact      Vestibulo-Ocular Reflex   VOR Cancellation  Normal    Comment  reports visual motion sensitivity with VOR cancellation      Positional Testing   Dix-Hallpike  Dix-Hallpike Right;Dix-Hallpike Left      Dix-Hallpike Right   Dix-Hallpike Right Duration  No nystagmus viewed in room light, however patient reports 25 second duration of sensation of mild sense of movement in his head    Dix-Hallpike Right Symptoms  No nystagmus      Dix-Hallpike Left   Dix-Hallpike Left Duration  none    Dix-Hallpike Left Symptoms  No nystagmus         VOMS HA 0-10 Dizziness 0-10 Nausea 0-10 Fogginess 0-10 Comments  BASELINE 0 0 0 0   Smooth pursuit 1 0 0 0   Saccades-horiz 0 1 0 0   Saccades-vert 0 0 0 0   Convergence 0 0 0 0 10cm  VOR-horiz 0 0 0 0   VOR-vert 3 0 0 0   Visual motion sensitivity 3 2 0 0        Objective measurements completed on examination: See above findings.       Vestibular  Treatment/Exercise - 07/21/19 1711      Vestibular Treatment/Exercise   Vestibular Treatment Provided  Gaze;Canalith Repositioning    Canalith Repositioning   Epley Manuever Right    Gaze Exercises  X1 Viewing Horizontal       EPLEY MANUEVER RIGHT   Number of Reps   1    Response Details   treated with R epley's maneuver due to symptomology during R dix hallpike testing although no nystagmus viewed in room light      X1 Viewing Horizontal   Foot Position  standing gaze x 1 with target 3 feet away with 30 degrees chin tuck x 30 second duration with cues on maintaining visual fixation to target            PT Education - 07/21/19 1712    Education Details  HEP to initiate gaze adaptation    Person(s) Educated  Patient    Methods  Explanation;Demonstration;Handout    Comprehension  Verbalized understanding;Returned demonstration     Recommended return to light jogging as long as no increase in HA that day or changes in sleep patterns beginning with short intervals of 3-4 minute light jog, 1 minute walk with total duration of 15 min.    PT Long Term Goals - 07/21/19 1713      PT LONG TERM GOAL #1   Title  The patient will return demo HEP for gaze adpatation, visual motion sensitivity/habituation, high level multi-sensory balance.    Time  6    Period  Weeks    Target Date  09/01/19      PT LONG TERM GOAL #2   Title  The patient will return to jogging and running x 20 minutes without c/o HA, dizziness, nausea, or fogginess.    Time  6    Period  Weeks    Target Date  09/01/19      PT LONG TERM GOAL #3   Title  The patient will improve gaze adaptation to tolerate 60 seconds at 2 Mhz pace without retinal slip or dizziness.    Time  6    Period  Weeks    Target Date  09/01/19      PT LONG TERM GOAL #4   Title  The patient will have no dizziness with R dix hallpike.    Time  6    Period  Weeks    Target Date  09/01/19      PT LONG TERM GOAL #5   Title  The patient will demonstrate foam standing with eyes closed x 30 seconds to demo improving multi-sensory balance.    Time  6    Period  Weeks    Target Date  09/01/19              Plan - 07/21/19 1715    Clinical Impression Statement  The patient is a 30 year old male presenting to OP PT with dx of post concussive syndrome with initial injury 04/27/2019.  He presents to PT today with symtpom of headache, visual motion sensitivity, dizziness with positional testing, difficulty with high level balance/multi-sensory balance tasks, and neck tightness.  The patient is unable to participate in recreational sports, has difficulty performing some job activities due to symptoms. PT to address deficits to return to prior level of function.    Examination-Activity Limitations  Other    Examination-Participation Restrictions  Community Activity    Stability/Clinical Decision Making  Stable/Uncomplicated    Clinical Decision Making  Low  Rehab Potential  Good    PT Frequency  1x / week    PT Duration  6 weeks    PT Treatment/Interventions  ADLs/Self Care Home Management;Vestibular;Canalith Repostioning;Neuromuscular re-education;Balance training;Manual techniques;Therapeutic exercise;Dry needling;Taping;Patient/family education;Gait training;Functional mobility training;Therapeutic activities;Visual/perceptual remediation/compensation    PT Next Visit Plan  complete BESS, assess neck mobility, further develop HEP to address c-spine/high level balance/habituation/progress gaze adaptation, discuss slow return to sport and progress jogging to tolerance (if exercise induced symptoms may do [place] Treadmill concussion test).    PT Home Exercise Plan  Access Code: [email or web address]    Consulted and Agree with Plan of Care  Patient       Patient will benefit from skilled therapeutic intervention in order to improve the following deficits and impairments:  Decreased balance, Pain, Hypomobility, Postural dysfunction, Dizziness, Decreased activity tolerance, Impaired vision/preception  Visit Diagnosis: Dizziness and giddiness  Other abnormalities of gait and mobility  BPPV (benign  paroxysmal positional vertigo), right     Problem List Patient Active Problem List   Diagnosis Date Noted  . Post concussion syndrome 07/12/2019  . Insomnia 07/12/2019  . Occipital neuralgia 07/12/2019  . Crohn's disease of intestine ([place]) 02/24/2005    Hagar Sadiq, PT 07/21/2019, 5:20 PM  [facility] [place], [place], [address] Phone: [phone]   Fax:  [phone]  Name: Phillip Mcclain MRN: [id number] Date of Birth: May 22, 1990

## 2019-07-21 NOTE — Patient Instructions (Signed)
Access Code: [email or web address]  URL: [email or web address]  Date: 07/21/2019  Prepared by: Rudell Cobb   Exercises Standing Gaze Stabilization with Head Rotation - 2 reps - 1 sets - 3x daily - 7x weekly

## 2019-07-28 ENCOUNTER — Ambulatory Visit (INDEPENDENT_AMBULATORY_CARE_PROVIDER_SITE_OTHER): Payer: 59 | Admitting: Rehabilitative and Restorative Service Providers"

## 2019-07-28 ENCOUNTER — Other Ambulatory Visit: Payer: Self-pay

## 2019-07-28 DIAGNOSIS — R42 Dizziness and giddiness: Secondary | ICD-10-CM

## 2019-07-28 DIAGNOSIS — H8111 Benign paroxysmal vertigo, right ear: Secondary | ICD-10-CM | POA: Diagnosis not present

## 2019-07-28 DIAGNOSIS — R2689 Other abnormalities of gait and mobility: Secondary | ICD-10-CM

## 2019-07-28 NOTE — Patient Instructions (Signed)
Access Code: [email or web address]  URL: [email or web address]  Date: 07/28/2019  Prepared by: Rudell Cobb   Exercises Brandt-Daroff Vestibular Exercise - 5 reps - 1 sets - 2x daily - 7x weekly Towel Roll Stretch - 5 reps - 1 sets - 3 seconds hold - 2x daily - 7x weekly Hooklying Upper Neck Extension - 5-10 reps - 1 sets - 2x daily - 7x weekly Standing Gaze Stabilization with Head Rotation - 2 reps - 1 sets - 3x daily - 7x weekly Standing Gaze Stabilization with Head Nod - 1 reps - 1 sets - 30 seconds hold - 3x daily - 7x weekly Wide Tandem Stance on Foam Pad with Eyes Closed - 3 reps - 1 sets - 30 seconds hold - 1x daily - 7x weekly

## 2019-07-28 NOTE — Therapy (Signed)
[place], [place], [address] Phone: [phone]   Fax:  [phone]  Physical Therapy Treatment  Patient Details  Name: Phillip Mcclain MRN: [id number] Date of Birth: 18-Dec-1990 Referring Provider (PT): [name], MD   Encounter Date: 07/28/2019  PT End of Session - 07/28/19 1017    Visit Number  2    Number of Visits  6    Date for PT Re-Evaluation  09/01/19    PT Start Time  0935    PT Stop Time  1018    PT Time Calculation (min)  43 min    Activity Tolerance  Patient tolerated treatment well    Behavior During Therapy  [facility] for tasks assessed/performed       Past Medical History:  Diagnosis Date  . Crohn's disease ([place])   . Crohn's disease of intestine ([place]) 02/24/2005    No past surgical history on file.  There were no vitals filed for this visit.  Subjective Assessment - 07/28/19 0934    Subjective  The patient did the eye exercises 3 times/day and notes it is more difficult with increasing speed.  The patient returned to light jogging 3 minutes on/2 minutes off.  At end of 15 minutes he did get increased pressure in his head that settled within minutes.  He was feeling better and returned to paint ball drills and got a HA that lasted x 1 hour after (we discussed this was too much).  He has noticed increased dizziness over the week and is unsure if it is related to increasing exercise. He kept a log:  After PT visit, he had 3/10 dizziness that settled within 65mnutes and fine rest of the day.  Saturday, he watched a competitive paint ball tournament and had increased tinnitus.  He got an episode of room spinning rated 5/10 when tilting his head back (in bed and then tested it) on 3/7.  He got pressure during paint ball drills in his head and jaw.  3/8 still had room spinning with tilting head back and had dizziness at work with head motion rated 2-3/10.  Had pressure through eyes on 3/8.  He had a  lot of head pressure and neck tightness on 3/9 due to a lot ofladder work on 3/8.  This week he has noticed sensations of nausea when scrolling on his phone.  On March 10, awoke with some dizziness and also got some tingling sensations behind his knees.  Dizziness is a sensation between his temples that moves back and forth rated a 3/10.  He felt some neck tightness intermittently this week.  This morning he had mild dizziness.    Patient Stated Goals  Return to prior activities without symptoms    Currently in Pain?  No/denies         [facility]      ROM / Strength   AROM / PROM / Strength  AROM      AROM   Overall AROM   Within functional limits for tasks performed    Overall AROM Comments  Patient with good neck flexibility, some limitation with extension and R rotation that iimproves with repetition      Palpation   Spinal mobility  decreased PA mobility C2-C3    Palpation comment  Tender to palpation over transverse and spinous process C2, C3         Vestibular Assessment - 07/28/19 0947  Positional Testing   Dix-Hallpike  Dix-Hallpike Right;Dix-Hallpike Left    Horizontal Canal Testing  Horizontal Canal Right;Horizontal Canal Left      Dix-Hallpike Right   Dix-Hallpike Right Duration  No nystagmus viewed in room light however subjective latency of 15 seconds followed by 10 second duration of dizziness    Dix-Hallpike Right Symptoms  No nystagmus      Dix-Hallpike Left   Dix-Hallpike Left Duration  none    Dix-Hallpike Left Symptoms  No nystagmus      Horizontal Canal Right   Horizontal Canal Right Duration  none    Horizontal Canal Right Symptoms  Normal      Horizontal Canal Left   Horizontal Canal Left Duration  none    Horizontal Canal Left Symptoms  Normal               [facility] Adult PT Treatment/Exercise - 07/28/19 1001      Neuro Re-ed    Neuro Re-ed Details   Compliant foam standing performing eyes closed * provided for  HEP      Exercises   Exercises  Neck      Neck Exercises: Supine   Neck Retraction  5 reps    Cervical Rotation  Right;Left;5 reps    Cervical Rotation Limitations  in extended position with towel roll; initial pain with R rotation that improves with repetition.      Vestibular Treatment/Exercise - 07/28/19 0951      Vestibular Treatment/Exercise   Vestibular Treatment Provided  Habituation;Gaze;Canalith Repositioning    Canalith Repositioning  Epley Manuever Right    Habituation Exercises  Nestor Lewandowsky    Gaze Exercises  X1 Viewing Horizontal;X1 Viewing Vertical       EPLEY MANUEVER RIGHT   Number of Reps   1    Overall Response  Improved Symptoms    Response Details   patient without dizziness with habituation (after completing epley's maneuver)      Nestor Lewandowsky   Number of Reps   1    Symptom Description   no symtpoms, provided for HEP to do if dizziness present this week      X1 Viewing Horizontal   Foot Position  standing gaze x 1 x 45 seconds.      X1 Viewing Vertical   Foot Position  standing gaze x 30 seconds            PT Education - 07/28/19 1358    Education Details  HEP progression    Person(s) Educated  Patient    Methods  Explanation;Demonstration;Handout    Comprehension  Returned demonstration;Verbalized understanding          PT Long Term Goals - 07/21/19 1713      PT LONG TERM GOAL #1   Title  The patient will return demo HEP for gaze adpatation, visual motion sensitivity/habituation, high level multi-sensory balance.    Time  6    Period  Weeks    Target Date  09/01/19      PT LONG TERM GOAL #2   Title  The patient will return to jogging and running x 20 minutes without c/o HA, dizziness, nausea, or fogginess.    Time  6    Period  Weeks    Target Date  09/01/19      PT LONG TERM GOAL #3   Title  The patient will improve gaze adaptation to tolerate 60 seconds at 2 Mhz pace without retinal slip or dizziness.  Time  6     Period  Weeks    Target Date  09/01/19      PT LONG TERM GOAL #4   Title  The patient will have no dizziness with R dix hallpike.    Time  6    Period  Weeks    Target Date  09/01/19      PT LONG TERM GOAL #5   Title  The patient will demonstrate foam standing with eyes closed x 30 seconds to demo improving multi-sensory balance.    Time  6    Period  Weeks    Target Date  09/01/19            Plan - 07/28/19 1359    Clinical Impression Statement  The patient had increased dizziness this week and continues with subjective dizziness consistent with BPPV ,however nystagmus not viewed in room light.  PT treated with improved symptoms.  Also assessed neck with some hypomobility noted.  Added HEP to address cervical mobilization and multi-sensory balance activities.    Examination-Activity Limitations  Other    Examination-Participation Restrictions  Community Activity    Stability/Clinical Decision Making  Stable/Uncomplicated    Rehab Potential  Good    PT Frequency  1x / week    PT Duration  6 weeks    PT Treatment/Interventions  ADLs/Self Care Home Management;Vestibular;Canalith Repostioning;Neuromuscular re-education;Balance training;Manual techniques;Therapeutic exercise;Dry needling;Taping;Patient/family education;Gait training;Functional mobility training;Therapeutic activities;Visual/perceptual remediation/compensation    PT Next Visit Plan  complete BESS, check HEP and further develop, discuss slow return to sport and progress jogging to tolerance (if exercise induced symptoms may do [place] Treadmill concussion test).    PT Home Exercise Plan  Access Code: [email or web address]    Consulted and Agree with Plan of Care  Patient       Patient will benefit from skilled therapeutic intervention in order to improve the following deficits and impairments:  Decreased balance, Pain, Hypomobility, Postural dysfunction, Dizziness, Decreased activity tolerance, Impaired vision/preception  Visit  Diagnosis: Dizziness and giddiness  Other abnormalities of gait and mobility  BPPV (benign paroxysmal positional vertigo), right     Problem List Patient Active Problem List   Diagnosis Date Noted  . Post concussion syndrome 07/12/2019  . Insomnia 07/12/2019  . Occipital neuralgia 07/12/2019  . Crohn's disease of intestine ([place]) 02/24/2005    Tazia Illescas , PT 07/28/2019, 2:00 PM  [facility] [place] [place], [place], [address] Phone: [phone]   Fax:  [phone]  Name: Phillip Mcclain MRN: [id number] Date of Birth: 11/17/1990

## 2019-08-02 ENCOUNTER — Ambulatory Visit
Admission: RE | Admit: 2019-08-02 | Discharge: 2019-08-02 | Disposition: A | Discharge status: Home / Self Care | Payer: 59 | Source: Ambulatory Visit | Attending: Neurology | Admitting: Neurology

## 2019-08-02 ENCOUNTER — Other Ambulatory Visit: Payer: Self-pay

## 2019-08-02 ENCOUNTER — Telehealth: Payer: Self-pay

## 2019-08-02 DIAGNOSIS — F0781 Postconcussional syndrome: Secondary | ICD-10-CM

## 2019-08-02 NOTE — Telephone Encounter (Signed)
Pt called informed MRI results are normal

## 2019-08-04 ENCOUNTER — Ambulatory Visit: Payer: 59 | Admitting: Rehabilitative and Restorative Service Providers"

## 2019-08-04 ENCOUNTER — Other Ambulatory Visit: Payer: Self-pay

## 2019-08-04 ENCOUNTER — Encounter: Payer: Self-pay | Admitting: Rehabilitative and Restorative Service Providers"

## 2019-08-04 DIAGNOSIS — R42 Dizziness and giddiness: Secondary | ICD-10-CM

## 2019-08-04 DIAGNOSIS — R2689 Other abnormalities of gait and mobility: Secondary | ICD-10-CM | POA: Diagnosis not present

## 2019-08-04 NOTE — Therapy (Signed)
[place]  [place], [place], [address] Phone: [phone]   Fax:  [phone]  Physical Therapy Treatment  Patient Details  Name: Phillip Mcclain MRN: [id number] Date of Birth: [birth year] Referring Provider (PT): [name], MD   Encounter Date: 08/04/2019  PT End of Session - 08/04/19 1256    Visit Number  3    Number of Visits  6    Date for PT Re-Evaluation  09/01/19    PT Start Time  9735    PT Stop Time  1238    PT Time Calculation (min)  45 min    Activity Tolerance  Patient tolerated treatment well    Behavior During Therapy  [facility] for tasks assessed/performed       Past Medical History:  Diagnosis Date  . Crohn's disease ([place])   . Crohn's disease of intestine ([place]) 02/24/2005    No past surgical history on file.  There were no vitals filed for this visit.  Subjective Assessment - 08/04/19 1153    Subjective  The patient reports he had a good first half of the week and then got increased symtpoms.  He tried glasses versus contacts and felt improvement with use of glasses.     After therapy, the patient felt intermittent dizziness.  He is noticing VOR leads to pressure in his head that settles in the L side, but does not stay with him afterwards.   On 3/15, he tried brandt daroff and got dizziness to the right side.  On 3/16, he developed increased nausea that worsened t/o the day.  He also had dizziness looking at his phone and began a "new sensation of dizziness" with worsening balance when he is up and moving around.  This morning, he noted a sensation of dizziness (more of a sensation in his head) upon waking.  He has jogged some this week and noticed head pressure (that resolved when activity stopped) one day and no pressure the next day with jogging.  Patient reports balance is significantly improved.    Patient Stated Goals  Return to prior activities without symptoms    Currently in Pain?  Yes    Pain Score  4     Pain Location  Neck    Pain Orientation  Left    Pain Radiating Towards  headache on the left    Pain Onset  More than a month ago    Pain Frequency  Intermittent             Vestibular Assessment - 08/04/19 1414      Dix-Hallpike Right   Dix-Hallpike Right Duration  no    Dix-Hallpike Right Symptoms  No nystagmus      Dix-Hallpike Left   Dix-Hallpike Left Duration  no    Dix-Hallpike Left Symptoms  No nystagmus      Horizontal Canal Right   Horizontal Canal Right Duration  none    Horizontal Canal Right Symptoms  Normal      Horizontal Canal Left   Horizontal Canal Left Duration  none    Horizontal Canal Left Symptoms  Normal               [facility] Adult PT Treatment/Exercise - 08/04/19 1228      Self-Care   Self-Care  Other Self-Care Comments    Other Self-Care Comments   Discussed:  1) Jogging:  Patient tolerated 2 days of 3 minutes jog/2 minutes walk x 3 sets with no sustained  symtpoms.  Recommended continue at current duration.  2)  Eyewear:  Patient feels improvement with use of glasses. Recomended wearing glasses for 1-2 weeks to allow consistent "good" days and then will use weekends to wear contacts shorter duration and perform gaze training with contact lenses  3) Balance:  Modified HEP to be more challenging as he feels initial exercises easier.  4)  Neck:  Modified HEP to add isometrics and end range mobilization with movement.  5) Gaze adaptation:  Increased to 60 seconds and demonstrated speed of 2 Hz for home program performance.  6)  Job:  Patient inquired if job was hindering his progress.  PT discussed return to normal routine is good for him and recovery can have ups and downs and is not a straight line of progress.        Neuro Re-ed    Neuro Re-ed Details   Demonstrated compliant standing on foam with feet together and eyes closed.  Patient able to perform tandem stance with eyes open and eyes closed with minimal postural  corrections.  Single leg stance with eyes closed x 10 seconds bilaterally with sway noted.      Exercises   Exercises  Neck      Neck Exercises: Seated   Cervical Isometrics  Right lateral flexion;Left lateral flexion;Right rotation;Left rotation;3 secs;5 reps    Cervical Rotation  Right;Left    Cervical Rotation Limitations  with a towel     Other Seated Exercise  self mobilization into rotation seated with towel      Neck Exercises: Supine   Neck Retraction  5 reps    Cervical Rotation  Right;Left;5 reps      Manual Therapy   Manual Therapy  Joint mobilization;Soft tissue mobilization    Manual therapy comments  in supine    Joint Mobilization  C2 L>R lateral glide grade II, PA mobilization grade II-III upper to mid c-spine    Soft tissue mobilization  paraspinal muscle STM L upper c-spine      Vestibular Treatment/Exercise - 08/04/19 1414      Vestibular Treatment/Exercise   Vestibular Treatment Provided  Habituation      Nestor Lewandowsky   Number of Reps   2    Symptom Description   mild sensation lasting <5 seconds to right side first rep            PT Education - 08/04/19 1256    Education Details  HEP    Person(s) Educated  Patient    Methods  Explanation;Demonstration;Handout    Comprehension  Verbalized understanding;Returned demonstration          PT Long Term Goals - 08/04/19 1222      PT LONG TERM GOAL #1   Title  The patient will return demo HEP for gaze adpatation, visual motion sensitivity/habituation, high level multi-sensory balance.    Time  6    Period  Weeks      PT LONG TERM GOAL #2   Title  The patient will return to jogging and running x 20 minutes without c/o HA, dizziness, nausea, or fogginess.    Time  6    Period  Weeks      PT LONG TERM GOAL #3   Title  The patient will improve gaze adaptation to tolerate 60 seconds at 2 Mhz pace without retinal slip or dizziness.    Time  6    Period  Weeks      PT LONG TERM GOAL #4  Title   The patient will have no dizziness with R dix hallpike.    Time  6    Period  Weeks    Status  Achieved      PT LONG TERM GOAL #5   Title  The patient will demonstrate foam standing with eyes closed x 30 seconds to demo improving multi-sensory balance.    Baseline  met on 08/04/19    Time  6    Period  Weeks    Status  Achieved            Plan - 08/04/19 1200    Clinical Impression Statement  The patient has met 2 LTGs (one for balance and one for negative dix hallpike).  He has some variability in progress but overall feeling improvement.  PT progressing patient through exercises to address:  vertigo, neck pain, gaze adaptation, multi-sensory balance, and motion sensitivity.  PT to continue working to [place] with emphasis on return to sport when able.    Examination-Activity Limitations  Other    Examination-Participation Restrictions  Community Activity    Stability/Clinical Decision Making  Stable/Uncomplicated    Rehab Potential  Good    PT Frequency  1x / week    PT Duration  6 weeks    PT Treatment/Interventions  ADLs/Self Care Home Management;Vestibular;Canalith Repostioning;Neuromuscular re-education;Balance training;Manual techniques;Therapeutic exercise;Dry needling;Taping;Patient/family education;Gait training;Functional mobility training;Therapeutic activities;Visual/perceptual remediation/compensation    PT Next Visit Plan  check HEP and further develop, discuss slow return to sport and progress jogging to tolerance (if exercise induced symptoms may do [place] Treadmill concussion test), neck manual mobilization and STM, add more motion provoked activitiy to tolerance.    PT Home Exercise Plan  Access Code: [email or web address]    Consulted and Agree with Plan of Care  Patient       Patient will benefit from skilled therapeutic intervention in order to improve the following deficits and impairments:  Decreased balance, Pain, Hypomobility, Postural dysfunction, Dizziness, Decreased  activity tolerance, Impaired vision/preception  Visit Diagnosis: Dizziness and giddiness  Other abnormalities of gait and mobility     Problem List Patient Active Problem List   Diagnosis Date Noted  . Post concussion syndrome 07/12/2019  . Insomnia 07/12/2019  . Occipital neuralgia 07/12/2019  . Crohn's disease of intestine ([place]) 02/24/2005    Bunn, PT 08/04/2019, 2:21 PM  [facility] [place], [place], [address] Phone: [phone]   Fax:  [phone]  Name: Phillip Mcclain MRN: [id number] Date of Birth: 01-25-1991

## 2019-08-04 NOTE — Patient Instructions (Signed)
Access Code: [email or web address] URL: [email or web address] Date: 08/04/2019 Prepared by: Rudell Cobb  Exercises Brandt-Daroff Vestibular Exercise - 2 x daily - 7 x weekly - 5 reps - 1 sets Towel Roll Stretch - 2 x daily - 7 x weekly - 5 reps - 1 sets - 3 seconds hold Standing Isometric Cervical Rotation - 2 x daily - 7 x weekly - 1 sets - 5 reps - 3 hold Standing Isometric Cervical Sidebending with Manual Resistance - 2 x daily - 7 x weekly - 1 sets - 5 reps - 3 hold Seated Assisted Cervical Rotation with Towel - 2 x daily - 7 x weekly - 1 sets - 3 reps - 15 seconds hold Standing Gaze Stabilization with Head Rotation - 3 x daily - 7 x weekly - 2 reps - 1 sets Standing Gaze Stabilization with Head Nod - 3 x daily - 7 x weekly - 1 reps - 1 sets - 30 seconds hold Single Leg Balance with Eyes Closed - 2 x daily - 7 x weekly - 1 sets - 2 reps - 10 seconds hold

## 2019-08-09 ENCOUNTER — Ambulatory Visit (INDEPENDENT_AMBULATORY_CARE_PROVIDER_SITE_OTHER): Payer: 59 | Admitting: Family Medicine

## 2019-08-09 ENCOUNTER — Encounter: Payer: Self-pay | Admitting: Family Medicine

## 2019-08-09 ENCOUNTER — Other Ambulatory Visit: Payer: Self-pay

## 2019-08-09 VITALS — BP 106/78 | HR 75 | Ht 65.0 in | Wt 113.8 lb

## 2019-08-09 DIAGNOSIS — M5481 Occipital neuralgia: Secondary | ICD-10-CM | POA: Diagnosis not present

## 2019-08-09 DIAGNOSIS — F0781 Postconcussional syndrome: Secondary | ICD-10-CM

## 2019-08-09 NOTE — Patient Instructions (Signed)
Thank you for coming in today. Keep up the current treatment.  Find the right balance between rest and pushing it with exercise and activity.  You will have some ups and downs.  Try the dry needling in PT.  Keep me updated.  Consider self guided Cognitive Behavioral Therapy (CBT) Consider for real talk therapy.  Let me know if you want to do that. We have video therapists and [facility] is [facility] here in [place].   Recheck in 1 month.   Return or contact me sooner if needed.
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
## 2019-08-11 ENCOUNTER — Other Ambulatory Visit: Payer: Self-pay

## 2019-08-11 ENCOUNTER — Ambulatory Visit (INDEPENDENT_AMBULATORY_CARE_PROVIDER_SITE_OTHER): Payer: 59 | Admitting: Rehabilitative and Restorative Service Providers"

## 2019-08-11 DIAGNOSIS — R42 Dizziness and giddiness: Secondary | ICD-10-CM

## 2019-08-11 DIAGNOSIS — R2689 Other abnormalities of gait and mobility: Secondary | ICD-10-CM

## 2019-08-11 NOTE — Therapy (Signed)
[place]  [place], [place], [address] Phone: [phone]   Fax:  [phone]  Physical Therapy Treatment  Patient Details  Name: Phillip Mcclain MRN: [id number] Date of Birth: 26-Sep-1990 Referring Provider (PT): [name], MD   Encounter Date: 08/11/2019  PT End of Session - 08/11/19 1000    Visit Number  4    Number of Visits  6    Date for PT Re-Evaluation  09/01/19    PT Start Time  0923    PT Stop Time  1010    PT Time Calculation (min)  47 min    Activity Tolerance  Patient tolerated treatment well    Behavior During Therapy  [facility] for tasks assessed/performed       Past Medical History:  Diagnosis Date  . Crohn's disease ([place])   . Crohn's disease of intestine ([place]) 02/24/2005    No past surgical history on file.  There were no vitals filed for this visit.  Subjective Assessment - 08/11/19 0925    Subjective  The patient reports the dizziness significantly improved this week.  He had one episode of nausea while working that settled within 10 minutes.  He has jogged 3 times this week and is up to 20 minutes.  When he begins a jog with neck tightness he notes greater head pressure.   He had some head pressure after jogging the 3rd time.    Patient Stated Goals  Return to prior activities without symptoms    Currently in Pain?  Yes    Pain Score  --   "stiffness"   Pain Location  Neck    Pain Descriptors / Indicators  Sore    Pain Type  Acute pain    Pain Onset  More than a month ago    Pain Frequency  Intermittent    Aggravating Factors   jogging increases awareness of neck tightness    Pain Relieving Factors  rest                       [facility] Adult PT Treatment/Exercise - 08/11/19 0947      Exercises   Exercises  Neck      Neck Exercises: Standing   Other Standing Exercises  Overhead shoulder press x 10 lbs R and L sides with cues for cervical positioning    Other Standing  Exercises  High row green band x 12 reps      Neck Exercises: Seated   Other Seated Exercise  self mobilization into rotation seated with towel      Neck Exercises: Prone   Other Prone Exercise  thoracic extension x 10 reps over edge of mat; prone press ups x 10 reps    Other Prone Exercise  Standing modified plank leaning on mat performing thoracic rotaiton/opening      Manual Therapy   Manual Therapy  Joint mobilization;Soft tissue mobilization    Manual therapy comments  in supine    Joint Mobilization  C2-C5 PA mobilizations,  grade II-III    Soft tissue mobilization  paraspinal musculature into bilateral upper trapezius musculature      Vestibular Treatment/Exercise - 08/11/19 1250      Vestibular Treatment/Exercise   Vestibular Treatment Provided  Habituation   lateral leaning, and drop/roll tasks for habituation   Habituation Exercises  Comment   performed task specific paint ball habituation x 8 minutes  PT Education - 08/11/19 1246    Education Details  progression of running to add sprinting and HEP for habituation for task specific drills for paint ball + HEP progression    Person(s) Educated  Patient    Methods  Explanation;Demonstration;Handout    Comprehension  Verbalized understanding;Returned demonstration          PT Long Term Goals - 08/11/19 1247      PT LONG TERM GOAL #1   Title  The patient will return demo HEP for gaze adpatation, visual motion sensitivity/habituation, high level multi-sensory balance.    Time  6    Period  Weeks    Target Date  09/01/19      PT LONG TERM GOAL #2   Title  The patient will return to jogging and running x 20 minutes without c/o HA, dizziness, nausea, or fogginess.    Time  6    Period  Weeks    Status  Achieved      PT LONG TERM GOAL #3   Title  The patient will improve gaze adaptation to tolerate 60 seconds at 2 Mhz pace without retinal slip or dizziness.    Time  6    Period  Weeks      PT  LONG TERM GOAL #4   Title  The patient will have no dizziness with R dix hallpike.    Time  6    Period  Weeks    Status  Achieved      PT LONG TERM GOAL #5   Title  The patient will demonstrate foam standing with eyes closed x 30 seconds to demo improving multi-sensory balance.    Baseline  met on 08/04/19    Time  6    Period  Weeks    Status  Achieved            Plan - 08/11/19 1247    Clinical Impression Statement  The patient is progressing to [place] meeting 3 LTGs at this time.  PT continuing to emphasize progression of strengthening for neck/cervical stabilization and return to sport.    Examination-Activity Limitations  Other    Examination-Participation Restrictions  Community Activity    Stability/Clinical Decision Making  Stable/Uncomplicated    Rehab Potential  Good    PT Frequency  1x / week    PT Duration  6 weeks    PT Treatment/Interventions  ADLs/Self Care Home Management;Vestibular;Canalith Repostioning;Neuromuscular re-education;Balance training;Manual techniques;Therapeutic exercise;Dry needling;Taping;Patient/family education;Gait training;Functional mobility training;Therapeutic activities;Visual/perceptual remediation/compensation    PT Next Visit Plan  Reduced frequency cancelling next week to allow time to progress HEP.  check HEP, return to sport, neck manual mobiization,    PT Home Exercise Plan  Access Code: [email or web address]    Consulted and Agree with Plan of Care  Patient       Patient will benefit from skilled therapeutic intervention in order to improve the following deficits and impairments:  Decreased balance, Pain, Hypomobility, Postural dysfunction, Dizziness, Decreased activity tolerance, Impaired vision/preception  Visit Diagnosis: Dizziness and giddiness  Other abnormalities of gait and mobility     Problem List Patient Active Problem List   Diagnosis Date Noted  . Post concussion syndrome 07/12/2019  . Occipital neuralgia 07/12/2019  .  Crohn's disease of intestine ([place]) 02/24/2005    Robina Hamor, PT 08/11/2019, 12:51 PM  [facility] [place], [place], [address] Phone: [phone]   Fax:  [phone]  Name: Phillip Mcclain MRN: [id number]  Date of Birth: [birth year]

## 2019-08-11 NOTE — Patient Instructions (Signed)
Access Code: [email or web address] URL: [email or web address] Date: 08/11/2019 Prepared by: Rudell Cobb  Program Notes Continue 20 minutes of 68mn jog/2 min walk 3 times/week. Add sprints (sprint straight edge of track, jog curves) x 2 laps x 2 times/week.   Exercises Brandt-Daroff Vestibular Exercise - 2 x daily - 7 x weekly - 5 reps - 1 sets Towel Roll Stretch - 2 x daily - 7 x weekly - 5 reps - 1 sets - 3 seconds hold Seated Assisted Cervical Rotation with Towel - 2 x daily - 7 x weekly - 1 sets - 3 reps - 15 seconds hold Standing Gaze Stabilization with Head Rotation - 3 x daily - 7 x weekly - 2 reps - 1 sets Standing Gaze Stabilization with Head Nod - 3 x daily - 7 x weekly - 1 reps - 1 sets - 30 seconds hold Single Leg Balance with Eyes Closed - 2 x daily - 7 x weekly - 1 sets - 2 reps - 10 seconds hold Prone Upper Back Extension Off Table with Hands Behind Head - 2 x daily - 7 x weekly - 10 reps - 1 sets Standing Row with Resistance with Anchored Resistance at Chest Height Palms Down - 2 x daily - 7 x weekly - 1 sets - 12-15 reps Plank with Thoracic Rotation on Counter - 2 x daily - 7 x weekly - 1 sets - 10 reps

## 2019-08-16 ENCOUNTER — Encounter: Payer: 59 | Admitting: Rehabilitative and Restorative Service Providers"

## 2019-08-25 ENCOUNTER — Ambulatory Visit (INDEPENDENT_AMBULATORY_CARE_PROVIDER_SITE_OTHER): Payer: 59 | Admitting: Rehabilitative and Restorative Service Providers"

## 2019-08-25 ENCOUNTER — Encounter: Payer: Self-pay | Admitting: Rehabilitative and Restorative Service Providers"

## 2019-08-25 ENCOUNTER — Other Ambulatory Visit: Payer: Self-pay

## 2019-08-25 DIAGNOSIS — H8111 Benign paroxysmal vertigo, right ear: Secondary | ICD-10-CM | POA: Diagnosis not present

## 2019-08-25 DIAGNOSIS — R2689 Other abnormalities of gait and mobility: Secondary | ICD-10-CM | POA: Diagnosis not present

## 2019-08-25 DIAGNOSIS — R42 Dizziness and giddiness: Secondary | ICD-10-CM

## 2019-08-25 NOTE — Patient Instructions (Signed)
FOR USING CONTACTS DURING LETTER EXERCISE:  Begin with plain background, working from 30 seconds up to 60 seconds. Once you can tolerate 60 seconds without difficulty, you can add in a busy background moving down to 30 seconds. Then progress up to 60 seconds with busy background.  FOR SPRINTS:  Begin with jogging working up to 20 minutes with contacts in.  Once you tolerate 20 minutes jogging, you can work towards sprinting with contacts in to tolerance.

## 2019-08-25 NOTE — Therapy (Signed)
[place], [place], [address] Phone: [phone]   Fax:  [phone]  Physical Therapy Treatment  Patient Details  Name: Phillip Mcclain MRN: [id number] Date of Birth: 29-Oct-1990 Referring Provider (PT): [name], MD   Encounter Date: 08/25/2019  PT End of Session - 08/25/19 1052    Visit Number  5    Number of Visits  6    Date for PT Re-Evaluation  09/01/19    PT Start Time  0933    PT Stop Time  1014    PT Time Calculation (min)  41 min    Activity Tolerance  Patient tolerated treatment well    Behavior During Therapy  [facility] for tasks assessed/performed       Past Medical History:  Diagnosis Date  . Crohn's disease ([place])   . Crohn's disease of intestine ([place]) 02/24/2005    History reviewed. No pertinent surgical history.  There were no vitals filed for this visit.  Subjective Assessment - 08/25/19 0931    Subjective  The patient reports he tried to wear his contacts.  He notes an "adjustment time" like he is putting on a new eyewear prescription.  Day after a busy tournament, he developed a HA and some dizziness.  He also felt some positional symptoms over the past 2 weeks that is intermittent and comes and goes.  He has done sprints 3 times. He can tolerate sprints with glasses on,, but not with contacts. He played paintball and wore contacts and played 5 points.  He got temporal pressure.    Patient Stated Goals  Return to prior activities without symptoms    Currently in Pain?  No/denies                       [facility] Adult PT Treatment/Exercise - 08/25/19 0937      Self-Care   Self-Care  Other Self-Care Comments    Other Self-Care Comments   Discussed:  1) NECK:  was better for the most part except one day woke with significant pain (he had been working in an awkward position).  He did not perform HEP on day 1 due to concern it would worsen and by day 2 was able to return to  neck stretches.  2) SPRINTING:  discussed slowly working towards sprinting with contacts in versus glasses.  To do this recommended he begin short jogging with contacts and when he can tolerate 20 minutes without worsening temporal pressure, begin to add sprints into workout with contact lenses.  3)  PAINTBALL/return to play:  recommend slow return to sport and discussed plan.  Also recommended a short rest time in between games and at end of matches to allow symptoms to reduce.        Exercises   Exercises  Other Exercises;Neck    Other Exercises   discussed continuing neck exercises to work through deep cervical tightness      Neck Exercises: Supine   Other Supine Exercise  flexion with cervical rotation for upper c-spine mobility      Manual Therapy   Manual Therapy  Joint mobilization;Soft tissue mobilization    Manual therapy comments  in supine    Joint Mobilization  C2-C3 lateral glide R>L grade II-III, PA mboility grade II-III and mobilization with movement into cervical extension    Soft tissue mobilization  paraspinal muscle STM R upper c-spine      Vestibular Treatment/Exercise - 08/25/19 1505  Vestibular Treatment/Exercise   Vestibular Treatment Provided  Psychologist, sport and exercise Manuever Right    Gaze Exercises  X1 Viewing Horizontal       EPLEY MANUEVER RIGHT   Number of Reps   1    Response Details   also taught self epley's for home mgmt of trace BPPV (appears to come and go      X1 Viewing Horizontal   Foot Position  standing 3 feet, then 5 feet from target and adding a busy background for visual stimulation    Comments  60 seconds with checkerboard background; then walked to/from target from 3 feet to 8 feet with gaze adaptation.            PT Education - 08/25/19 1052    Education Details  progressed gaze to busy background, added plan for sprints with contacts and discussed return to play    Person(s) Educated   Patient    Methods  Explanation;Demonstration;Handout    Comprehension  Verbalized understanding;Returned demonstration          PT Long Term Goals - 08/25/19 1127      PT LONG TERM GOAL #1   Title  The patient will return demo HEP for gaze adpatation, visual motion sensitivity/habituation, high level multi-sensory balance.    Time  6    Period  Weeks    Status  On-going      PT LONG TERM GOAL #2   Title  The patient will return to jogging and running x 20 minutes without c/o HA, dizziness, nausea, or fogginess.    Time  6    Period  Weeks    Status  Achieved      PT LONG TERM GOAL #3   Title  The patient will improve gaze adaptation to tolerate 60 seconds at 2 Mhz pace without retinal slip or dizziness.    Time  6    Period  Weeks    Status  Achieved      PT LONG TERM GOAL #4   Title  The patient will have no dizziness with R dix hallpike.    Time  6    Period  Weeks    Status  Achieved      PT LONG TERM GOAL #5   Title  The patient will demonstrate foam standing with eyes closed x 30 seconds to demo improving multi-sensory balance.    Baseline  met on 08/04/19    Time  6    Period  Weeks    Status  Achieved            Plan - 08/25/19 1134    Clinical Impression Statement  The patient has met 4 LTGs at this time.  He is progressing well with intermittent symtpoms and managing them well.  PT and patient discussed return to play and we plan to f/u in 2 weeks.  Will renew at that time if further visits needed/indicated.    Examination-Activity Limitations  Other    Examination-Participation Restrictions  Community Activity    Stability/Clinical Decision Making  Stable/Uncomplicated    Rehab Potential  Good    PT Frequency  1x / week    PT Duration  6 weeks    PT Treatment/Interventions  ADLs/Self Care Home Management;Vestibular;Canalith Repostioning;Neuromuscular re-education;Balance training;Manual techniques;Therapeutic exercise;Dry  needling;Taping;Patient/family education;Gait training;Functional mobility training;Therapeutic activities;Visual/perceptual remediation/compensation    PT Next Visit Plan  allow time to progress HEP.  check HEP, return to  sport, neck manual mobiization,    PT Home Exercise Plan  Access Code: [email or web address]    Consulted and Agree with Plan of Care  Patient       Patient will benefit from skilled therapeutic intervention in order to improve the following deficits and impairments:  Decreased balance, Pain, Hypomobility, Postural dysfunction, Dizziness, Decreased activity tolerance, Impaired vision/preception  Visit Diagnosis: Dizziness and giddiness  Other abnormalities of gait and mobility  BPPV (benign paroxysmal positional vertigo), right     Problem List Patient Active Problem List   Diagnosis Date Noted  . Post concussion syndrome 07/12/2019  . Occipital neuralgia 07/12/2019  . Crohn's disease of intestine ([place]) 02/24/2005    [place], PT 08/25/2019, 11:39 AM  [facility] [place], [place], [address] Phone: [phone]   Fax:  [phone]  Name: Phillip Mcclain MRN: [id number] Date of Birth: 27-Oct-1990

## 2019-09-01 ENCOUNTER — Encounter: Payer: 59 | Admitting: Rehabilitative and Restorative Service Providers"

## 2019-09-08 ENCOUNTER — Other Ambulatory Visit: Payer: Self-pay

## 2019-09-08 ENCOUNTER — Ambulatory Visit (INDEPENDENT_AMBULATORY_CARE_PROVIDER_SITE_OTHER): Payer: 59 | Admitting: Rehabilitative and Restorative Service Providers"

## 2019-09-08 DIAGNOSIS — R2689 Other abnormalities of gait and mobility: Secondary | ICD-10-CM | POA: Diagnosis not present

## 2019-09-08 DIAGNOSIS — R42 Dizziness and giddiness: Secondary | ICD-10-CM

## 2019-09-08 DIAGNOSIS — H8111 Benign paroxysmal vertigo, right ear: Secondary | ICD-10-CM | POA: Diagnosis not present

## 2019-09-08 NOTE — Therapy (Signed)
[place], [place], [address] Phone: [phone]   Fax:  [phone]  Physical Therapy Treatment and Renewal/ Goal update  Patient Details  Name: Phillip Mcclain MRN: [id number] Date of Birth: 1991-04-02 Referring Provider (PT): [name], MD   Encounter Date: 09/08/2019  PT End of Session - 09/08/19 1006    Visit Number  6    Number of Visits  10    Date for PT Re-Evaluation  10/20/19    PT Start Time  0931    PT Stop Time  1008    PT Time Calculation (min)  37 min    Activity Tolerance  Patient tolerated treatment well    Behavior During Therapy  [facility] for tasks assessed/performed       Past Medical History:  Diagnosis Date  . Crohn's disease ([place])   . Crohn's disease of intestine ([place]) 02/24/2005    No past surgical history on file.  There were no vitals filed for this visit.  Subjective Assessment - 09/08/19 0929    Subjective  Return to play is going well (used glasses) and was able to do 2 days in a row.  He feels he is doing really well in that category.  He has been doing the neck stretches 2 times/day and he feels occasional soreness that is relieved by stretching.  He notices less headaches day to day.  Most of the 2 weeks he has not been dizzy.  He switches into contacts for the afternoon/evening and continues to wear glasses at work.  Busy background VOR does provoke some dizziness.  He tried contacts Tuesday and he had increased dizziness by mid afternoon.  He feels this set him back and he continues with pressure in temporal region and tightness into his teeth with dizziness noticed every day.  He reports he may want to try dry needling due to he feels he still has to manage his neck (with stretches), and wants to return to pre-concussion of no awareness of neck.    Patient Stated Goals  Return to prior activities without symptoms    Currently in Pain?  No/denies              Vestibular Assessment - 09/08/19 0937      Vestibular Assessment   General Observation  His baseline dizziness is 5-6/10.  He got dizziness when performing "ear crystal" treatment and had symptoms.   He also notes some increased pressure in his ears.      Positional Testing   Dix-Hallpike  Dix-Hallpike Right;Dix-Hallpike Left      Dix-Hallpike Right   Dix-Hallpike Right Duration  describes a beating sensation that wants to move into spinning but does not (lasts for 15 seconds)    Dix-Hallpike Right Symptoms  No nystagmus   viewed in room light     Dix-Hallpike Left   Dix-Hallpike Left Duration  no    Dix-Hallpike Left Symptoms  No nystagmus      Horizontal Canal Right   Horizontal Canal Right Duration  none    Horizontal Canal Right Symptoms  Normal      Horizontal Canal Left   Horizontal Canal Left Duration  none    Horizontal Canal Left Symptoms  Normal               [facility] Adult PT Treatment/Exercise - 09/08/19 0946      Self-Care   Self-Care  Other Self-Care Comments    Other Self-Care Comments  Discussed symtpoms:  Patient notes he can now look at busy visual backgrounds, he continues with ear and head pressure occasionally and tinnitus.  We discussed that this week's set back came after working x hours and that he has increased dizziness.  He should treat at homem with self epley's maneuver + habituation as needed until he has 2 days without vertigo/dizziness.  Nystagmus are not viewed in room light, but he subjectively has sensation of dizziness with R dix hallpike.  We dicussed neck tightness that is resolved with daily stretches.  He inquires about pursuing dry needling to treat remaining tightness-- PT scheduled one visit for dry needlingand will schedule more as indicated.      Vestibular Treatment/Exercise - 09/08/19 0947      Vestibular Treatment/Exercise   Vestibular Treatment Provided  Canalith Repositioning    Canalith Repositioning   Epley Manuever Right       EPLEY MANUEVER RIGHT   Number of Reps   1    Overall Response  Improved Symptoms    Response Details   Then had patient perform a self epley's maneuver                  PT Long Term Goals - 09/08/19 1000      PT LONG TERM GOAL #1   Title  The patient will return demo HEP for gaze adpatation, visual motion sensitivity/habituation, high level multi-sensory balance.    Baseline  The patient met this goal.    Time  6    Period  Weeks    Status  Achieved    Target Date  09/01/19      PT LONG TERM GOAL #2   Title  The patient will return to jogging and running x 20 minutes without c/o HA, dizziness, nausea, or fogginess.    Time  6    Period  Weeks    Status  Achieved      PT LONG TERM GOAL #3   Title  The patient will improve gaze adaptation to tolerate 60 seconds at 2 Mhz pace without retinal slip or dizziness.    Time  6    Period  Weeks    Status  Achieved      PT LONG TERM GOAL #4   Title  The patient will have no dizziness with R dix hallpike.    Time  6    Period  Weeks    Status  Achieved *this was achieved and patient had a recurrence this week.  He is able to tolerate self treatment with epley's and brandt daroff habituation.     PT LONG TERM GOAL #5   Title  The patient will demonstrate foam standing with eyes closed x 30 seconds to demo improving multi-sensory balance.    Baseline  met on 08/04/19    Time  6    Period  Weeks    Status  Achieved        UPDATED LONG TERM GOALS:    PT Long Term Goals - 09/08/19 1017      PT LONG TERM GOAL #1   Title  The patient will report resolution of intermittent neck tightness.    Time  6    Period  Weeks    Target Date  10/20/19         Plan - 09/08/19 1013    Clinical Impression Statement  The patient has met all LTGs initially established.  He continues with intermittent dizziness that  he is able to manage with R self epley's maneuver and brandt daroff habituation.  He is also  managing neck tightness that is intermittent in nature and responds well to self mobilization C spine HEP.  He initially requested to hold dry needling as symtpoms were reduced with manual therapy in PT + HEP.  Since the tightness is continuing, he has decided to pursue a visit for dry needling to determine if it can relieve the minimal symtpoms he continues with for c-spine.  PT is renewing x 4 weeks (with 6 week target date) to allow time to address remaining cervical tightness.  Balance, gaze exercises going well.  Patient has returned to play at 100% effort without symptoms when wearing eye glasses.  His work environment (working hours looking up and down) may create gravity depenedent situations in which BPPV recurs.  He is able to manage these symptoms through home treatment.    PT Frequency  1x / week    PT Duration  4 weeks    PT Treatment/Interventions  ADLs/Self Care Home Management;Vestibular;Canalith Repostioning;Neuromuscular re-education;Balance training;Manual techniques;Therapeutic exercise;Dry needling;Taping;Patient/family education;Gait training;Functional mobility training;Therapeutic activities;Visual/perceptual remediation/compensation    PT Next Visit Plan  assess c-spine for dry needling due to continued intermittent tightness post concussively; pt has met other goals and is indep with self mgmt of recurring BPPV.    PT Home Exercise Plan  Access Code: [email or web address]    Consulted and Agree with Plan of Care  Patient        Patient will benefit from skilled therapeutic intervention in order to improve the following deficits and impairments:     Visit Diagnosis: No diagnosis found.     Problem List Patient Active Problem List   Diagnosis Date Noted  . Post concussion syndrome 07/12/2019  . Occipital neuralgia 07/12/2019  . Crohn's disease of intestine ([place]) 02/24/2005    [place], PT 09/08/2019, 10:17 AM  [facility] [place], [place], [address] Phone: [phone]   Fax:  [phone]  Name: Phillip Mcclain MRN: [id number] Date of Birth: Sep 03, 1990

## 2019-09-11 ENCOUNTER — Other Ambulatory Visit: Payer: Self-pay

## 2019-09-11 ENCOUNTER — Encounter: Payer: Self-pay | Admitting: Rehabilitative and Restorative Service Providers"

## 2019-09-11 ENCOUNTER — Ambulatory Visit (INDEPENDENT_AMBULATORY_CARE_PROVIDER_SITE_OTHER): Payer: 59 | Admitting: Rehabilitative and Restorative Service Providers"

## 2019-09-11 DIAGNOSIS — R42 Dizziness and giddiness: Secondary | ICD-10-CM

## 2019-09-11 DIAGNOSIS — R2689 Other abnormalities of gait and mobility: Secondary | ICD-10-CM

## 2019-09-11 NOTE — Therapy (Signed)
[place], [place], [address] Phone: [phone]   Fax:  [phone]  Physical Therapy Treatment  Patient Details  Name: Phillip Mcclain MRN: [id number] Date of Birth: November 14, 1990 Referring Provider (PT): [name], MD   Encounter Date: 09/11/2019  PT End of Session - 09/11/19 0757    Visit Number  7    Number of Visits  10    Date for PT Re-Evaluation  10/20/19    PT Start Time  5681    PT Stop Time  0845    PT Time Calculation (min)  48 min    Activity Tolerance  Patient tolerated treatment well       Past Medical History:  Diagnosis Date  . Crohn's disease ([facility])   . Crohn's disease of intestine ([facility]) 02/24/2005    History reviewed. No pertinent surgical history.  There were no vitals filed for this visit.  Subjective Assessment - 09/11/19 0758    Subjective  Patient reports that the neck "won't seem to get better" Has continued pain and soreness in the posterior cervical area. The towel stretch has helped. Interested in DN to see if that will help.    Currently in Pain?  Yes    Pain Score  1     Pain Location  Neck    Pain Orientation  Left;Right    Pain Descriptors / Indicators  Tightness    Pain Type  Acute pain    Pain Onset  More than a month ago                       [facility] Adult PT Treatment/Exercise - 09/11/19 0001      Neck Exercises: Standing   Other Standing Exercises  scap squeeze with noodle 10 sec x 10 reps       Moist Heat Therapy   Number Minutes Moist Heat  10 Minutes    Moist Heat Location  Cervical;Shoulder      Manual Therapy   Manual therapy comments  skilled palpation for assessment of tissue tightness and changes for DN     Joint Mobilization  cervical PA and lateral glides pt supine     Soft tissue mobilization  deep tissue work through the posterior cervical musculature - noted increased tightness Lt > Rt       Neck Exercises: Stretches   Other Neck Stretches  supine on noodle prolonged snow angel ~ 1-2 min some dizziness reported at end of activitiy.     Other Neck Stretches  doorway stretch 3 positions 30 sec x 2 reps each        Trigger Point Dry Needling - 09/11/19 0001    Consent Given?  Yes    Education Handout Provided  Yes    Dry Needling Comments  bilat pt prone     Upper Trapezius Response  Palpable increased muscle length    Suboccipitals Response  Palpable increased muscle length    Cervical multifidi Response  Palpable increased muscle length           PT Education - 09/11/19 0818    Education Details  HEP DN    Person(s) Educated  Patient    Methods  Explanation;Demonstration;Tactile cues;Verbal cues;Handout    Comprehension  Verbalized understanding;Returned demonstration;Verbal cues required;Tactile cues required          PT Long Term Goals - 09/08/19 1017      PT LONG TERM GOAL #1  Title  The patient will report resolution of intermittent neck tightness.    Time  6    Period  Weeks    Target Date  10/20/19            Plan - 09/11/19 0806    Clinical Impression Statement  Rounded posture in sitting and slight forward position of head of the humerus in standing. Added pec stretch. Will continue with posterior shoulder girdle strengthening. Trial of DN to address muscular tightness through the cervical area. tolerated treatment.    Rehab Potential  Good    PT Frequency  1x / week    PT Duration  4 weeks    PT Treatment/Interventions  ADLs/Self Care Home Management;Vestibular;Canalith Repostioning;Neuromuscular re-education;Balance training;Manual techniques;Therapeutic exercise;Dry needling;Taping;Patient/family education;Gait training;Functional mobility training;Therapeutic activities;Visual/perceptual remediation/compensation    PT Next Visit Plan  add pec stretch; continue with posterior ahoulder girdle strengthening; assess response to DN    PT Home Exercise Plan  Access Code:  [email or web address]    Consulted and Agree with Plan of Care  Patient       Patient will benefit from skilled therapeutic intervention in order to improve the following deficits and impairments:     Visit Diagnosis: Dizziness and giddiness  Other abnormalities of gait and mobility     Problem List Patient Active Problem List   Diagnosis Date Noted  . Post concussion syndrome 07/12/2019  . Occipital neuralgia 07/12/2019  . Crohn's disease of intestine ([facility]) 02/24/2005    Tyjanae Bartek Nilda Simmer PT, MPH  09/11/2019, 8:48 AM  [facility] [place], [place], [address] Phone: [phone]   Fax:  [phone]  Name: Amel Gianino MRN: [id number] Date of Birth: Apr 08, 1991

## 2019-09-11 NOTE — Patient Instructions (Addendum)
Trigger Point Dry Needling  What is Trigger Point Dry Needling (DN)? DN is a physical therapy technique used to treat muscle pain and dysfunction. Specifically, DN helps deactivate muscle trigger points (muscle knots).  A thin filiform needle is used to penetrate the skin and stimulate the underlying trigger point. The goal is for a local twitch response (LTR) to occur and for the trigger point to relax. No medication of any kind is injected during the procedure.   What Does Trigger Point Dry Needling Feel Like?  The procedure feels different for each individual patient. Some patients report that they do not actually feel the needle enter the skin and overall the process is not painful. Very mild bleeding may occur. However, many patients feel a deep cramping in the muscle in which the needle was inserted. This is the local twitch response.   How Will I feel after the treatment? Soreness is normal, and the onset of soreness may not occur for a few hours. Typically this soreness does not last longer than two days.  Bruising is uncommon, however; ice can be used to decrease any possible bruising.  In rare cases feeling tired or nauseous after the treatment is normal. In addition, your symptoms may get worse before they get better, this period will typically not last longer than 24 hours.   What Can I do After My Treatment? Increase your hydration by drinking more water for the next 24 hours. You may place ice or heat on the areas treated that have become sore, however, do not use heat on inflamed or bruised areas. Heat often brings more relief post needling. You can continue your regular activities, but vigorous activity is not recommended initially after the treatment for 24 hours. DN is best combined with other physical therapy such as strengthening, stretching, and other therapies.     Access Code: [email or web address]: [email or web address]: 04/26/2021Prepared by: Veronique Warga HoltProgram  Notes Continue 20 minutes of 48mn jog/2 min walk 3 times/week.  Add sprints (sprint straight edge of track, jog curves) x 2 laps x 2 times/week. Exercises  Towel Roll Stretch - 2 x daily - 7 x weekly - 5 reps - 1 sets - 3 seconds hold  Seated Assisted Cervical Rotation with Towel - 2 x daily - 7 x weekly - 1 sets - 3 reps - 15 seconds hold  Standing Gaze Stabilization with Head Rotation - 3 x daily - 7 x weekly - 2 reps - 1 sets  Standing Gaze Stabilization with Head Nod - 3 x daily - 7 x weekly - 1 reps - 1 sets - 30 seconds hold  Single Leg Balance with Eyes Closed - 2 x daily - 7 x weekly - 1 sets - 2 reps - 10 seconds hold  Prone Upper Back Extension Off Table with Hands Behind Head - 2 x daily - 7 x weekly - 10 reps - 1 sets  Standing Row with Resistance with Anchored Resistance at Chest Height Palms Down - 2 x daily - 7 x weekly - 1 sets - 12-15 reps  Plank with Thoracic Rotation on Counter - 2 x daily - 7 x weekly - 1 sets - 10 reps  Self-Epley Maneuver Right Ear - 2 x daily - 7 x weekly - 1 sets - 1-2 reps  Brandt-Daroff Vestibular Exercise - 2 x daily - 7 x weekly - 5 reps - 1 sets  Doorway Pec Stretch at 60 Degrees Abduction - 3 x daily - 7 x weekly -  3 reps - 1 sets  Doorway Pec Stretch at 90 Degrees Abduction - 3 x daily - 7 x weekly - 3 reps - 1 sets - 30 seconds hold  Doorway Pec Stretch at 120 Degrees Abduction - 3 x daily - 7 x weekly - 3 reps - 1 sets - 30 second hold hold  Standing Scapular Retraction - 2 x daily - 7 x weekly - 1 sets - 10 reps - 10 sec hold Patient Education  Trigger Point Dry Needling

## 2019-09-14 ENCOUNTER — Encounter: Payer: Self-pay | Admitting: Family Medicine

## 2019-09-14 ENCOUNTER — Other Ambulatory Visit: Payer: Self-pay

## 2019-09-14 ENCOUNTER — Ambulatory Visit (INDEPENDENT_AMBULATORY_CARE_PROVIDER_SITE_OTHER): Payer: 59 | Admitting: Family Medicine

## 2019-09-14 VITALS — BP 110/70 | HR 82 | Ht 65.0 in | Wt 114.8 lb

## 2019-09-14 DIAGNOSIS — F0781 Postconcussional syndrome: Secondary | ICD-10-CM | POA: Diagnosis not present

## 2019-09-14 DIAGNOSIS — R42 Dizziness and giddiness: Secondary | ICD-10-CM | POA: Diagnosis not present

## 2019-09-14 DIAGNOSIS — H9313 Tinnitus, bilateral: Secondary | ICD-10-CM | POA: Diagnosis not present

## 2019-09-14 DIAGNOSIS — H9319 Tinnitus, unspecified ear: Secondary | ICD-10-CM | POA: Insufficient documentation

## 2019-09-14 HISTORY — DX: Dizziness and giddiness: R42

## 2019-09-14 HISTORY — DX: Tinnitus, unspecified ear: H93.19

## 2019-09-14 NOTE — Progress Notes (Signed)
Subjective:    Chief Complaint: Phillip Mcclain, LAT, ATC, am serving as scribe for Dr. [name].  Phillip Mcclain, DOB: [birth year], is a 29 y.o. male who presents for concussion f/u after hitting his head on his truck door on 04/27/19.  He was last seen by Dr. Georgina Snell on 08/09/19 and reported con't issues w/ dizziness, neck pain and tingling in the L side of his face.  He has completed 7 PT sessions.  Since his last visit, pt reports his symptoms have been off and on.  He states that he's had an increase in his dizziness over the past week.  He had dry needling done at his last PT session and he feels that helped w/ his neck pain and tightness.  He has played paintball 3x.  He can play full w/ his glasses on but has issues when he tries to play in his contact.   Injury date : 04/27/19 Visit #: 3   History of Present Illness:    Concussion Self-Reported Symptom Score Symptoms rated on a scale 1-6, in last 24 hours   Headache: 1    Nausea: 0  Dizziness: 3  Vomiting: 0  Balance Difficulty: 0   Trouble Falling Asleep: 0   Fatigue: 0  Sleep Less Than Usual: 0  Daytime Drowsiness: 0  Sleep More Than Usual: 0  Photophobia: 0  Phonophobia: 1  Irritability: 0  Sadness: 0  Numbness or Tingling: 0  Nervousness: 0  Feeling More Emotional: 0  Feeling Mentally Foggy: 0  Feeling Slowed Down: 0  Memory Problems: 0  Difficulty Concentrating: 0  Visual Problems: 0   Total # of Symptoms: 3/22 Total Symptom Score: 5/132 Previous Total # of Symptoms: 4/22 Previous Symptom Score: 6/132   Neck Pain: Yes  Tinnitus: Yes    Objective:    Physical Examination Vitals:   09/14/19 0748  BP: 110/70  Pulse: 82  SpO2: 98%   MSK: Normal cervical motion Neuro: Balance coordination and gait Psych: Alert and oriented normal speech thought process and affect.   Assessment and Plan   29 yo male with postconcussion syndrome.  Overall mostly improved but still having some issues with balance  tinnitus and neck pain.  At this point he is about 4 months out from his concussion and is approaching maximum medical improvement.  I still think he has room for improvement but he is now in the postconcussion phase rather than the acute concussion phase.  Discussed that we can pursue physical therapy off and on as needed.  Additionally at this point I do not think neuroimaging will be very helpful but I am happy to arrange for brain MRI or even brain and neck MRA/brain MRI to fully evaluate potential causes of dizziness including vertebral artery and carotid artery dysfunction.  Patient will keep in touch and we will proceed with further treatment if needed.  He mentions that he will be traveling to [place] in May.  Discussed that if he becomes symptomatic with dizziness and [place] I can prescribe prednisone and meclizine to a local pharmacy if needed.    Check back with me as needed.      Action/Discussion: Reviewed diagnosis, management options, expected outcomes, and the reasons for scheduled and emergent follow-up. Questions were adequately answered. Patient expressed verbal understanding and agreement with the following plan.     Patient Education:  Reviewed with patient the risks (i.e, a repeat concussion, post-concussion syndrome, second-impact syndrome) of returning to play prior to complete  resolution, and thoroughly reviewed the signs and symptoms of concussion.Reviewed need for complete resolution of all symptoms, with rest AND exertion, prior to return to play.  Reviewed red flags for urgent medical evaluation: worsening symptoms, nausea/vomiting, intractable headache, musculoskeletal changes, focal neurological deficits.  [facility] Concussion Care Plan, which clearly outlines the plans stated above, was given to patient.   In addition to the time spent performing tests, I spent 30 min   Reviewed with patient the risks (i.e, a repeat concussion,  post-concussion syndrome, second-impact syndrome) of returning to play prior to complete resolution, and thoroughly reviewed the signs and symptoms of      concussion. Reviewedf need for complete resolution of all symptoms, with rest AND exertion, prior to return to play.  Reviewed red flags for urgent medical evaluation: worsening symptoms, nausea/vomiting, intractable headache, musculoskeletal changes, focal neurological deficits.  [facility] Concussion Care Plan, which clearly outlines the plans stated above, was given to patient   After Visit Summary printed out and provided to patient as appropriate.  The above documentation has been reviewed and is accurate and complete [name]

## 2019-09-14 NOTE — Patient Instructions (Signed)
Thank you for coming in today.  Continue home exercises.  I am happy to arrange for future PT or future imaging.  I think it is not likely that we will need to do more tests.  Keep stressing the balance system.

## 2019-09-21 ENCOUNTER — Encounter: Payer: 59 | Admitting: Rehabilitative and Restorative Service Providers"

## 2019-11-02 ENCOUNTER — Ambulatory Visit (INDEPENDENT_AMBULATORY_CARE_PROVIDER_SITE_OTHER): Payer: 59 | Admitting: Family Medicine

## 2019-11-02 ENCOUNTER — Encounter: Payer: Self-pay | Admitting: Family Medicine

## 2019-11-02 ENCOUNTER — Other Ambulatory Visit: Payer: Self-pay

## 2019-11-02 VITALS — BP 120/86 | HR 71 | Ht 65.0 in | Wt 115.8 lb

## 2019-11-02 DIAGNOSIS — M5481 Occipital neuralgia: Secondary | ICD-10-CM | POA: Diagnosis not present

## 2019-11-02 DIAGNOSIS — F411 Generalized anxiety disorder: Secondary | ICD-10-CM | POA: Diagnosis not present

## 2019-11-02 DIAGNOSIS — F0781 Postconcussional syndrome: Secondary | ICD-10-CM | POA: Diagnosis not present

## 2019-11-02 MED ORDER — NORTRIPTYLINE HCL 25 MG PO CAPS: 25.0000 mg | ORAL_CAPSULE | Freq: Every evening | ORAL | 2 refills | Status: DC | PRN

## 2019-11-02 NOTE — Progress Notes (Signed)
Subjective:    Chief Complaint: [name], LAT, ATC, am serving as scribe for Dr. [name].  Phillip Mcclain,  is a 29 y.o. male who presents for f/u of concussion sustained on 04/27/19 when he hit his head on his truck door.  He was last seen by Dr. Georgina Snell on 09/14/19 and noted improvement in his symptoms but con't to c/o neck pain, dizziness and tingling on the L side of his face.  He has completed 7 PT sessions.  Since his last visit, pt reports that he was feeling really well and felt like his symptoms were resolving.  Last Saturday, he was flustered and was trying to get out of his door at his house that was sticking and ran his shoulder into the door to get it open.  Since then, he has been having increased neck pain in the suboccipital area, nausea, brain fog.  He notes his friend took Cymbalta had a real hard time stopping it and in general it is resistant to the idea of medications for anxiety.   Injury date : 04/27/19 Visit #: 4   History of Present Illness:    Concussion Self-Reported Symptom Score Symptoms rated on a scale 1-6, in last 24 hours   Headache: 3    Nausea: 3  Dizziness: 2  Vomiting: 0  Balance Difficulty: 1   Trouble Falling Asleep: 0   Fatigue: 2  Sleep Less Than Usual: 0  Daytime Drowsiness: 2  Sleep More Than Usual: 0  Photophobia: 0  Phonophobia: 3  Irritability: 1  Sadness: 1  Numbness or Tingling: 0  Nervousness: 2  Feeling More Emotional: 2  Feeling Mentally Foggy: 2  Feeling Slowed Down: 0  Memory Problems: 0  Difficulty Concentrating: 0  Visual Problems: 0   Total # of Symptoms: 12/22 Total Symptom Score: 24/132 Previous Total # of Symptoms: 3/22 Previous Symptom Score: 5/132   Neck Pain: Yes  Tinnitus: Yes  Review of Systems: No fevers or chills    Review of History: Crohn's disease  Objective:    Physical Examination Vitals:   11/02/19 0908  BP: 120/86  Pulse: 71  SpO2: 100%   MSK: C-spine normal motion Neuro:  Alert and oriented normal gait Psych: Normal speech thought process and affect.  Patient expresses anxiety and irritability.  He denies any trouble sleeping.    Assessment and Plan   29 y.o. male with postconcussion syndrome.  Still having symptoms and had a bit of a relapse recently without much head injury or head impact.  Spent a lot of time today discussing his medical options.  I think we should address headache and anxiety symptoms with medicine at this point. Discussed various medication options including Prozac Cymbalta for anxiety and nortriptyline for headache.  Patient would like to avoid Cymbalta due to his friends experience try to stop it.  Additionally he would like to think about Prozac before starting it which is reasonable.  Plan to start however nortriptyline at bedtime for headache prevention.  Patient will do some thinking and let me know how he is feeling in the near future will reassess then.  Also recommend restarting neck exercises that he was taught in physical therapy.  Okay to read or physical therapy if needed.    Action/Discussion: Reviewed diagnosis, management options, expected outcomes, and the reasons for scheduled and emergent follow-up. Questions were adequately answered. Patient expressed verbal understanding and agreement with the following plan.     Patient Education:  Reviewed  with patient the risks (i.e, a repeat concussion, post-concussion syndrome, second-impact syndrome) of returning to play prior to complete resolution, and thoroughly reviewed the signs and symptoms of concussion.Reviewed need for complete resolution of all symptoms, with rest AND exertion, prior to return to play.  Reviewed red flags for urgent medical evaluation: worsening symptoms, nausea/vomiting, intractable headache, musculoskeletal changes, focal neurological deficits.  [facility] Con[facility] Plan, which clearly outlines the plans stated above, was  given to patient.   In addition to the time spent performing tests, I spent 30 min   Reviewed with patient the risks (i.e, a repeat concussion, post-concussion syndrome, second-impact syndrome) of returning to play prior to complete resolution, and thoroughly reviewed the signs and symptoms of      concussion. Reviewedf need for complete resolution of all symptoms, with rest AND exertion, prior to return to play.  Reviewed red flags for urgent medical evaluation: worsening symptoms, nausea/vomiting, intractable headache, musculoskeletal changes, focal neurological deficits.  [facility] Concussion Care Plan, which clearly outlines the plans stated above, was given to patient   After Visit Summary printed out and provided to patient as appropriate.  The above documentation has been reviewed and is accurate and complete [name]

## 2019-11-02 NOTE — Patient Instructions (Addendum)
Thank you for coming in today. I think we should address the headache and the anxiety.   Headache: Recommend Nortriptyline at bedtime. This is helpful for headache prevention..   For anxiety SSRI type medicines. Favor Prozac as first line because it is easy to stop.  Could consider Cymbalta as it does have pain benefits but can make you feel bad if you stop it abruptly.   Let me know what you want to do.   Most people can tolerate these medicines ok but sometimes we need to try more than 1 to find the one that work for you the best.   I recommend restarting the exercises. Let me know if you want a referral back to PT.

## 2019-11-23 ENCOUNTER — Encounter: Payer: Self-pay | Admitting: Family Medicine

## 2019-11-23 DIAGNOSIS — R42 Dizziness and giddiness: Secondary | ICD-10-CM

## 2019-11-23 DIAGNOSIS — F0781 Postconcussional syndrome: Secondary | ICD-10-CM

## 2019-11-27 ENCOUNTER — Other Ambulatory Visit: Payer: Self-pay

## 2019-11-27 ENCOUNTER — Ambulatory Visit (INDEPENDENT_AMBULATORY_CARE_PROVIDER_SITE_OTHER): Payer: 59 | Admitting: Rehabilitative and Restorative Service Providers"

## 2019-11-27 ENCOUNTER — Encounter: Payer: Self-pay | Admitting: Rehabilitative and Restorative Service Providers"

## 2019-11-27 DIAGNOSIS — H8111 Benign paroxysmal vertigo, right ear: Secondary | ICD-10-CM

## 2019-11-27 DIAGNOSIS — R2689 Other abnormalities of gait and mobility: Secondary | ICD-10-CM | POA: Diagnosis not present

## 2019-11-27 DIAGNOSIS — R42 Dizziness and giddiness: Secondary | ICD-10-CM

## 2019-11-27 NOTE — Therapy (Signed)
[place], [place], [address] Phone: [phone]   Fax:  [phone]  Physical Therapy Evaluation  Patient Details  Name: Phillip Mcclain MRN: [id number] Date of Birth: [birth year] Referring Provider (PT): [name], MD   Encounter Date: 11/27/2019   PT End of Session - 11/27/19 1623    Visit Number 1    Number of Visits 6    Date for PT Re-Evaluation 01/08/20    PT Start Time 0849    PT Stop Time 0932    PT Time Calculation (min) 43 min           Past Medical History:  Diagnosis Date   Crohn's disease ([place])    Crohn's disease of intestine ([place]) 02/24/2005   Dizzy 09/14/2019   Postconcussion syndrome spring 2021   Tinnitus 09/14/2019   Following concussion 2021    History reviewed. No pertinent surgical history.  There were no vitals filed for this visit.    Subjective Assessment - 11/27/19 0850    Subjective The patient reports his symptoms were 95% resolved (dizziness occasional and no neck pain).  He had returned to paint ball without restrictions.  He shouldered a door (was in a hurry) and it didn't open.  His current symptoms are tightness in bilat temporal region, tooth pain (superior aspect), and tenderness to touch in cervical paraspinals.  He has some tightness with cervical rotation.  He is experiencing the symptoms daily (feels the threat of symptoms returning).  His balance also feels off.  The phone is a trigger for HA accompanied by imbalance and dizziness.  He is not playing paintball as regularly.  He has tried to do some jogging/walking with exacerbation of symptoms.  He has had one episode of vertigo and treated it at home with Epley's maneuver.    Patient Stated Goals reduce daily symptoms    Currently in Pain? Yes    Pain Score 0-No pain    Pain Location Neck    Aggravating Factors  tightness on turns and pain with palpation    Pain Relieving Factors avoiding  palpation/touching neck    Multiple Pain Sites Yes    Pain Score 0    Pain Location Head    Pain Orientation --   temporal tightness   Pain Descriptors / Indicators Headache    Aggravating Factors  "feels a constant threat of something going on"              [facility] PT Assessment - 11/27/19 0902      Assessment   Medical Diagnosis post concussion syndrome    Referring Provider (PT) [name], MD    Onset Date/Surgical Date 04/27/19   return of symptoms 10/28/19   Prior Therapy known to our clinic from prior physical therapy      [place] residence    Living Arrangements Spouse/significant other      Prior Function   Level of Independence Independent    Vocation Full time employment    Haematologist-- crawls into attic spaces, tight spaces at times    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        Palpation   Spinal mobility decreased PA mobility  T2-T4 with discomfort, C5-C7 PA mobility.    Palpation comment tender to palpation over cervical paraspinals, bilateral scalenes, splenius capittus, and cervical multifidi      Balance   Balance Assessed Yes      Dynamic Standing Balance   Dynamic Standing - Comments Standing balance on foam with eyes open and eyes closed x 30 seconds with increased sway.  Single leg stance x 10 seconds with eyes open/ eyes closed                  Vestibular Assessment - 11/27/19 1648       Vestibular Assessment   General Observation Patient without dizziness at rest.      Oculomotor Exam   Comment Visual motion sensitivity with VOR cancellation.              Objective measurements completed on examination: See above findings.       [facility] PT Treatment/Exercise - 11/27/19 0902      Neuro Re-ed    Neuro Re-ed Details  VOR cancellation provokes a mild tightness and he gets some "weakness" sensation in the back of the legs      Exercises   Exercises Neck      Neck Exercises: Supine   Other Supine Exercise foam rolling upper back           Vestibular Treatment/Exercise - 11/27/19 1654      Vestibular Treatment/Exercise   Gaze Exercises Eye/Head Exercise Horizontal      Eye/Head Exercise Horizontal   Foot Position visual motion sensitivity             Exercises Seated Levator Scapulae Stretch - 2 x daily - 7 x weekly - 1 sets - 3 reps - 20 seconds hold Supine Chest Stretch on Foam Roll - 2 x daily - 7 x weekly - 1 sets - 10 reps - 2-3 minutes hold Supine Thoracic Mobilization Foam Roll Horizontal with Arm Stretch - 2 x daily - 7 x weekly - 1 sets - 1 reps - 2 minutes hold Thoracic Mobilization on Foam Roll - 2 x daily - 7 x weekly - 1 sets - 1 reps Standing High Shoulder Row with Anchored Resistance - 2 x daily - 7 x weekly - 1 sets - 10 reps Romberg Stance Eyes Closed on Foam Pad - 2 x daily - 7 x weekly - 1 sets - 3 reps - 30 seconds hold Single Leg Balance with Eyes Closed - 2 x daily - 7 x weekly - 1 sets - 3 reps - 10 seconds hold Standing VOR Cancellation - 2 x daily - 7 x weekly - 1 sets - 5-10 reps      PT Education - 11/27/19 1623    Education Details established updated HEP    Person(s) Educated Patient    Methods Explanation;Demonstration;Handout    Comprehension Returned demonstration;Verbalized understanding               PT Long Term Goals - 11/27/19 1624      PT LONG TERM GOAL #1   Title The patient will be indep  with HEP.    Time  6    Period Weeks    Target Date 01/08/20      PT LONG TERM GOAL #2   Title The patient will report dec'd HA frequency from daily to < or equal to 2x/week.    Time 6    Period Weeks    Target Date 01/08/20      PT LONG TERM GOAL #3   Title The patient will perform cervical AROM without c/o neck tightness.    Time 6    Period Weeks    Target Date 01/08/20      PT LONG TERM GOAL #4   Title The patient will tolerate scrolling on his phone without c/o headache/head tightness.    Time 6    Period Weeks    Target Date 01/08/20      PT LONG TERM GOAL #5   Title The patient will return to recreational tasks (paintball) without HA or neck pain.    Time 6    Period Weeks    Target Date 01/08/20                  Plan - 11/27/19 1627    Clinical Impression Statement The patient is a 29 yo male returning to OP reahb s/p concussion 04/27/2019.  He had improved to 90-95% of prior status and had an increase in symptoms after hitting a door with his shoulder 10/28/19.  As compared to prior episode of care, he currently has increased tenderness in c-spine (to palpation), hypomobility of T2-T4 and C5-C7, tightness with end range AROM c-spine, visual sensitivity with scrolling (sensitivity to VOR cancellation), and dec'd multi-sensory balance.  PT to address deficits to return to prior functional status.    Personal Factors and Comorbidities Comorbidity 1    Comorbidities concussion    Examination-Participation Restrictions Other   recreational activities/ sports   Stability/Clinical Decision Making Stable/Uncomplicated    Clinical Decision Making Low    Rehab Potential Good    PT Frequency 1x / week    PT Duration 6 weeks    PT Treatment/Interventions ADLs/Self Care Home Management;Vestibular;Canalith Repostioning;Neuromuscular re-education;Balance training;Manual techniques;Therapeutic exercise;Dry needling;Taping;Patient/family education;Gait training;Functional  mobility training;Therapeutic activities;Visual/perceptual remediation/compensation    PT Next Visit Plan cervical stretching, joint mobilization C-spine, upper T-spine and CT junction, multi-sensory balance, visual motion sensitivity training    PT Home Exercise Plan Access Code: [email or web address]    Consulted and Agree with Plan of Care Patient           Patient will benefit from skilled therapeutic intervention in order to improve the following deficits and impairments:  Decreased balance, Pain, Hypomobility, Postural dysfunction, Dizziness, Decreased activity tolerance, Impaired vision/preception  Visit Diagnosis: Dizziness and giddiness  Other abnormalities of gait and mobility  BPPV (benign paroxysmal positional vertigo), right     Problem List Patient Active Problem List   Diagnosis Date Noted   Tinnitus 09/14/2019   Dizzy 09/14/2019   Post concussion syndrome 07/12/2019   Crohn's disease of intestine ([place]) 02/24/2005    Alto, PT 11/27/2019, 4:54 PM  [place], [place], [address] Phone: [phone]   Fax:  [phone]  Name: Phillip Mcclain MRN: [id number] Date of Birth: 12/18/1990

## 2019-11-27 NOTE — Patient Instructions (Signed)
Access Code: [email or web address] URL: [email or web address] Date: 11/27/2019 Prepared by: Rudell Cobb  Exercises Seated Levator Scapulae Stretch - 2 x daily - 7 x weekly - 1 sets - 3 reps - 20 seconds hold Supine Chest Stretch on Foam Roll - 2 x daily - 7 x weekly - 1 sets - 10 reps - 2-3 minutes hold Supine Thoracic Mobilization Foam Roll Horizontal with Arm Stretch - 2 x daily - 7 x weekly - 1 sets - 1 reps - 2 minutes hold Thoracic Mobilization on Foam Roll - 2 x daily - 7 x weekly - 1 sets - 1 reps Standing High Shoulder Row with Anchored Resistance - 2 x daily - 7 x weekly - 1 sets - 10 reps Romberg Stance Eyes Closed on Foam Pad - 2 x daily - 7 x weekly - 1 sets - 3 reps - 30 seconds hold Single Leg Balance with Eyes Closed - 2 x daily - 7 x weekly - 1 sets - 3 reps - 10 seconds hold Standing VOR Cancellation - 2 x daily - 7 x weekly - 1 sets - 5-10 reps

## 2019-11-29 ENCOUNTER — Encounter: Payer: Self-pay | Admitting: Family Medicine

## 2019-12-04 ENCOUNTER — Other Ambulatory Visit: Payer: Self-pay

## 2019-12-04 ENCOUNTER — Encounter: Payer: Self-pay | Admitting: Rehabilitative and Restorative Service Providers"

## 2019-12-04 ENCOUNTER — Ambulatory Visit (INDEPENDENT_AMBULATORY_CARE_PROVIDER_SITE_OTHER): Payer: 59 | Admitting: Rehabilitative and Restorative Service Providers"

## 2019-12-04 DIAGNOSIS — R42 Dizziness and giddiness: Secondary | ICD-10-CM | POA: Diagnosis not present

## 2019-12-04 DIAGNOSIS — H8111 Benign paroxysmal vertigo, right ear: Secondary | ICD-10-CM

## 2019-12-04 DIAGNOSIS — R2689 Other abnormalities of gait and mobility: Secondary | ICD-10-CM

## 2019-12-04 NOTE — Patient Instructions (Signed)
Access Code: [email or web address]: [email or web address]: 07/19/2021Prepared by: Denese Mentink HoltProgram Notes Continue 20 minutes of 70mn jog/2 min walk 3 times/week.  Add sprints (sprint straight edge of track, jog curves) x 2 laps x 2 times/week. Exercises  Towel Roll Stretch - 2 x daily - 7 x weekly - 5 reps - 1 sets - 3 seconds hold  Seated Assisted Cervical Rotation with Towel - 2 x daily - 7 x weekly - 1 sets - 3 reps - 15 seconds hold  Standing Gaze Stabilization with Head Rotation - 3 x daily - 7 x weekly - 2 reps - 1 sets  Standing Gaze Stabilization with Head Nod - 3 x daily - 7 x weekly - 1 reps - 1 sets - 30 seconds hold  Single Leg Balance with Eyes Closed - 2 x daily - 7 x weekly - 1 sets - 2 reps - 10 seconds hold  Prone Upper Back Extension Off Table with Hands Behind Head - 2 x daily - 7 x weekly - 10 reps - 1 sets  Standing Row with Resistance with Anchored Resistance at Chest Height Palms Down - 2 x daily - 7 x weekly - 1 sets - 12-15 reps  Plank with Thoracic Rotation on Counter - 2 x daily - 7 x weekly - 1 sets - 10 reps  Self-Epley Maneuver Right Ear - 2 x daily - 7 x weekly - 1 sets - 1-2 reps  Brandt-Daroff Vestibular Exercise - 2 x daily - 7 x weekly - 5 reps - 1 sets  Doorway Pec Stretch at 60 Degrees Abduction - 3 x daily - 7 x weekly - 3 reps - 1 sets  Doorway Pec Stretch at 90 Degrees Abduction - 3 x daily - 7 x weekly - 3 reps - 1 sets - 30 seconds hold  Doorway Pec Stretch at 120 Degrees Abduction - 3 x daily - 7 x weekly - 3 reps - 1 sets - 30 second hold hold  Standing Scapular Retraction - 2 x daily - 7 x weekly - 1 sets - 10 reps - 10 sec hold  Doorway Pec Stretch at 60 Degrees Abduction - 3 x daily - 7 x weekly - 3 reps - 1 sets  Doorway Pec Stretch at 90 Degrees Abduction - 3 x daily - 7 x weekly - 3 reps - 1 sets - 30 seconds hold  Doorway Pec Stretch at 120 Degrees Abduction - 3 x daily - 7 x weekly - 3 reps - 1 sets - 30 second hold hold   Trigger  Point Dry Needling  . What is Trigger Point Dry Needling (DN)? o DN is a physical therapy technique used to treat muscle pain and dysfunction. Specifically, DN helps deactivate muscle trigger points (muscle knots).  o A thin filiform needle is used to penetrate the skin and stimulate the underlying trigger point. The goal is for a local twitch response (LTR) to occur and for the trigger point to relax. No medication of any kind is injected during the procedure.   . What Does Trigger Point Dry Needling Feel Like?  o The procedure feels different for each individual patient. Some patients report that they do not actually feel the needle enter the skin and overall the process is not painful. Very mild bleeding may occur. However, many patients feel a deep cramping in the muscle in which the needle was inserted. This is the local twitch response.   .Marland KitchenHow Will I feel after the treatment?  o Soreness is normal, and the onset of soreness may not occur for a few hours. Typically this soreness does not last longer than two days.  o Bruising is uncommon, however; ice can be used to decrease any possible bruising.  o In rare cases feeling tired or nauseous after the treatment is normal. In addition, your symptoms may get worse before they get better, this period will typically not last longer than 24 hours.   . What Can I do After My Treatment? o Increase your hydration by drinking more water for the next 24 hours. o You may place ice or heat on the areas treated that have become sore, however, do not use heat on inflamed or bruised areas. Heat often brings more relief post needling. o You can continue your regular activities, but vigorous activity is not recommended initially after the treatment for 24 hours. o DN is best combined with other physical therapy such as strengthening, stretching, and other therapies.

## 2019-12-04 NOTE — Therapy (Addendum)
[place], [place], [address] Phone: [phone]   Fax:  [phone]  Physical Therapy Treatment  Patient Details  Name: Phillip Mcclain MRN: [id number] Date of Birth: Jan 25, 1991 Referring Provider (PT): [name], MD   Encounter Date: 12/04/2019   PT End of Session - 12/04/19 0755    Visit Number 2    Number of Visits 6    Date for PT Re-Evaluation 01/08/20    PT Start Time 0756    PT Stop Time 3300   MH end of treatment   PT Time Calculation (min) 40 min    Activity Tolerance Patient tolerated treatment well           Past Medical History:  Diagnosis Date  . Crohn's disease ([place])   . Crohn's disease of intestine ([place]) 02/24/2005  . Dizzy 09/14/2019   Postconcussion syndrome spring 2021  . Tinnitus 09/14/2019   Following concussion 2021    History reviewed. No pertinent surgical history.  There were no vitals filed for this visit.   Subjective Assessment - 12/04/19 0756    Subjective Dizziness is just there when he has a random headache symptom. Neck is doing better. Sore to touch but not so painful. Did well with DN last time his neck was hurting. Neck was really sore the day following treatment then gradually improved with full resolution of pain in about a week to week and a half. Wants to try that again.    Currently in Pain? Yes    Pain Score 5    to touch   Pain Location Neck    Pain Orientation Right;Left    Pain Descriptors / Indicators Sore    Pain Type Acute pain                             [facility] Adult PT Treatment/Exercise - 12/04/19 0001      Neck Exercises: Machines for Strengthening   UBE (Upper Arm Bike) L3 x 3 min alternating fwd/back       Neck Exercises: Seated   Neck Retraction 5 reps   10 sec hold    Lateral Flexion Right;Left   3 reps x 5 sec hold    Shoulder Rolls Backwards;5 reps      Moist Heat Therapy   Number Minutes Moist Heat 10  Minutes    Moist Heat Location Cervical;Shoulder      Manual Therapy   Manual Therapy Joint mobilization;Soft tissue mobilization    Manual therapy comments skilled palpation for assessment of tissue tightness and changes for DN     Joint Mobilization cervical PA and lateral glides pt supine     Soft tissue mobilization deep tissue work through the posterior cervical musculature - noted increased tightness Lt > Rt       Neck Exercises: Stretches   Other Neck Stretches doorway stretch 3 positions 30 sec x 2 reps each             Trigger Point Dry Needling - 12/04/19 0001    Consent Given? Yes    Education Handout Provided Previously provided    Dry Needling Comments bilat pt prone     Upper Trapezius Response Palpable increased muscle length    Suboccipitals Response Palpable increased muscle length    Cervical multifidi Response Palpable increased muscle length  PT Education - 12/04/19 0808    Education Details HEP DN    Person(s) Educated Patient    Methods Explanation;Demonstration;Tactile cues;Verbal cues;Handout    Comprehension Verbalized understanding;Returned demonstration;Verbal cues required;Tactile cues required               PT Long Term Goals - 11/27/19 1624      PT LONG TERM GOAL #1   Title The patient will be indep with HEP.    Time 6    Period Weeks    Target Date 01/08/20      PT LONG TERM GOAL #2   Title The patient will report dec'd HA frequency from daily to < or equal to 2x/week.    Time 6    Period Weeks    Target Date 01/08/20      PT LONG TERM GOAL #3   Title The patient will perform cervical AROM without c/o neck tightness.    Time 6    Period Weeks    Target Date 01/08/20      PT LONG TERM GOAL #4   Title The patient will tolerate scrolling on his phone without c/o headache/head tightness.    Time 6    Period Weeks    Target Date 01/08/20      PT LONG TERM GOAL #5   Title The patient will return to  recreational tasks (paintball) without HA or neck pain.    Time 6    Period Weeks    Target Date 01/08/20                 Plan - 12/04/19 0759    Clinical Impression Statement Good improvement in dizziness. Neck is sore to touch but movement is improving per pt report. Added pec stretch in three positions. Patient tolerated DN well with some soreness fllowing DN and manual work.    Rehab Potential Good    PT Frequency 1x / week    PT Duration 6 weeks    PT Treatment/Interventions ADLs/Self Care Home Management;Vestibular;Canalith Repostioning;Neuromuscular re-education;Balance training;Manual techniques;Therapeutic exercise;Dry needling;Taping;Patient/family education;Gait training;Functional mobility training;Therapeutic activities;Visual/perceptual remediation/compensation    PT Next Visit Plan cervical stretching, joint mobilization C-spine, upper T-spine and CT junction, multi-sensory balance, visual motion sensitivity training; assess respones to DN and manual work    PT Home Exercise Plan Access Code: [email or web address]    Consulted and Agree with Plan of Care Patient           Patient will benefit from skilled therapeutic intervention in order to improve the following deficits and impairments:     Visit Diagnosis: Dizziness and giddiness  Other abnormalities of gait and mobility  BPPV (benign paroxysmal positional vertigo), right     Problem List Patient Active Problem List   Diagnosis Date Noted  . Tinnitus 09/14/2019  . Dizzy 09/14/2019  . Post concussion syndrome 07/12/2019  . Crohn's disease of intestine ([place]) 02/24/2005    Marissa Weaver Nilda Simmer PT, MPH  12/04/2019, 8:37 AM  [facility] [place], [place], [address] Phone: [phone]   Fax:  [phone]  Name: Phillip Mcclain MRN: [id number] Date of Birth: 09-Feb-1991

## 2019-12-11 ENCOUNTER — Other Ambulatory Visit: Payer: Self-pay

## 2019-12-11 ENCOUNTER — Ambulatory Visit (INDEPENDENT_AMBULATORY_CARE_PROVIDER_SITE_OTHER): Payer: 59 | Admitting: Rehabilitative and Restorative Service Providers"

## 2019-12-11 DIAGNOSIS — R42 Dizziness and giddiness: Secondary | ICD-10-CM

## 2019-12-11 DIAGNOSIS — R2689 Other abnormalities of gait and mobility: Secondary | ICD-10-CM

## 2019-12-11 DIAGNOSIS — H8111 Benign paroxysmal vertigo, right ear: Secondary | ICD-10-CM

## 2019-12-11 NOTE — Patient Instructions (Signed)
Access Code: [email or web address] URL: [email or web address] Date: 12/11/2019 Prepared by: Rudell Cobb  Exercises Seated Levator Scapulae Stretch - 2 x daily - 7 x weekly - 1 sets - 3 reps - 20 seconds hold Sternocleidomastoid Stretch - 2 x daily - 7 x weekly - 1 sets - 3 reps - 20-30 seconds hold Supine Chest Stretch on Foam Roll - 2 x daily - 7 x weekly - 1 sets - 1 reps - 2-3 minutes hold Supine Thoracic Mobilization Foam Roll Horizontal with Arm Stretch - 2 x daily - 7 x weekly - 1 sets - 1 reps - 2 minutes hold Thoracic Mobilization on Foam Roll - 2 x daily - 7 x weekly - 1 sets - 1 reps Standing High Shoulder Row with Anchored Resistance - 2 x daily - 7 x weekly - 1 sets - 10 reps Doorway Pec Stretch at 90 Degrees Abduction - 2 x daily - 7 x weekly - 1 sets - 3 reps - 30 seconds hold Standing Isometric Cervical Retraction with Chin Tucks and Ball at [employer] - 2 x daily - 7 x weekly - 1 sets - 5 reps - 3 seconds hold Single Leg Balance with Eyes Closed - 2 x daily - 7 x weekly - 1 sets - 3 reps - 10 seconds hold Standing VOR Cancellation - 2 x daily - 7 x weekly - 1 sets - 5-10 reps

## 2019-12-11 NOTE — Therapy (Signed)
[place], [place], [address] Phone: [phone]   Fax:  [phone]  Physical Therapy Treatment  Patient Details  Name: Phillip Mcclain MRN: [id number] Date of Birth: July 23, 1990 Referring Provider (PT): [name], MD   Encounter Date: 12/11/2019   PT End of Session - 12/11/19 0757    Visit Number 3    Number of Visits 6    Date for PT Re-Evaluation 01/08/20    PT Start Time 0758    PT Stop Time 0840    PT Time Calculation (min) 42 min    Activity Tolerance Patient tolerated treatment well           Past Medical History:  Diagnosis Date  . Crohn's disease ([place])   . Crohn's disease of intestine ([place]) 02/24/2005  . Dizzy 09/14/2019   Postconcussion syndrome spring 2021  . Tinnitus 09/14/2019   Following concussion 2021    No past surgical history on file.  There were no vitals filed for this visit.   Subjective Assessment - 12/11/19 0757    Subjective The patient reports he is doing well today.  He has transitioned back to contacts and is adjusting.  He is getting migraine symptoms, but they resolve faster.  He reports walking balance, nausea, and sensation of weakness in the back of his legs is gone.  He is tolerating his phone use better. Some days, he wakes feeling that symptoms could come on easily.  He reports some dizziness described as a "floaty" sensation that he feels could be ear crystals, but when he tests it, it doesn't come on.    Patient Stated Goals reduce daily symptoms    Currently in Pain? Yes   "today is the best day"   Pain Score 0-No pain                   Vestibular Assessment - 12/11/19 0813      Positional Testing   Dix-Hallpike Dix-Hallpike Right;Dix-Hallpike Left    Horizontal Canal Testing Horizontal Canal Right;Horizontal Canal Left      Dix-Hallpike Right   Dix-Hallpike Right Duration none    Dix-Hallpike Right Symptoms No nystagmus      Dix-Hallpike  Left   Dix-Hallpike Left Duration trace "beat"    Dix-Hallpike Left Symptoms No nystagmus      Horizontal Canal Right   Horizontal Canal Right Duration none    Horizontal Canal Right Symptoms Normal      Horizontal Canal Left   Horizontal Canal Left Duration none    Horizontal Canal Left Symptoms Normal                    [facility] Adult PT Treatment/Exercise - 12/11/19 3557      Neuro Re-ed    Neuro Re-ed Details  reviewed foam standing with eyes closed (no longer challenging) and single leg stance with eyes closed; also reviewed VOR cancellation.      Exercises   Exercises Neck      Neck Exercises: Standing   Neck Retraction 5 reps    Neck Retraction Limitations isometric contraction into ball (creates some discomfort); contracted into folded pillow with improved tolerance      Neck Exercises: Seated   Other Seated Exercise sternocleidomastoid stretching seated R and L      Neck Exercises: Supine   Other Supine Exercise reviewed foam rolling and foam roll self mobilizaton t-spine techniques from HEP      Manual  Therapy   Manual Therapy Soft tissue mobilization    Manual therapy comments skilled palpation and STM    Soft tissue mobilization soft tissue massage and trigger point massage with passive stretching SCM           Vestibular Treatment/Exercise - 12/11/19 0815      Vestibular Treatment/Exercise   Vestibular Treatment Provided Habituation;Gaze    Habituation Exercises Legrand Como Daroff   Number of Reps  1    Symptom Description  no symptoms provoked                 PT Education - 12/11/19 0839    Education Details HEP modified/updated    Person(s) Educated Patient    Methods Explanation;Demonstration;Handout    Comprehension Returned demonstration;Verbalized understanding               PT Long Term Goals - 11/27/19 1624      PT LONG TERM GOAL #1   Title The patient will be indep with HEP.    Time 6    Period  Weeks    Target Date 01/08/20      PT LONG TERM GOAL #2   Title The patient will report dec'd HA frequency from daily to < or equal to 2x/week.    Time 6    Period Weeks    Target Date 01/08/20      PT LONG TERM GOAL #3   Title The patient will perform cervical AROM without c/o neck tightness.    Time 6    Period Weeks    Target Date 01/08/20      PT LONG TERM GOAL #4   Title The patient will tolerate scrolling on his phone without c/o headache/head tightness.    Time 6    Period Weeks    Target Date 01/08/20      PT LONG TERM GOAL #5   Title The patient will return to recreational tasks (paintball) without HA or neck pain.    Time 6    Period Weeks    Target Date 01/08/20                 Plan - 12/11/19 0845    Clinical Impression Statement The patient is improving with balance, dizziness overall.  Intensity and duration of migraines is also improving, but still occurs frequenty with short duration.  PT progressed HEP adding SCM stretch due to rams horn pattern of HA.  PT to continue to assess and adjust HEP as indicated.    Rehab Potential Good    PT Frequency 1x / week    PT Duration 6 weeks    PT Treatment/Interventions ADLs/Self Care Home Management;Vestibular;Canalith Repostioning;Neuromuscular re-education;Balance training;Manual techniques;Therapeutic exercise;Dry needling;Taping;Patient/family education;Gait training;Functional mobility training;Therapeutic activities;Visual/perceptual remediation/compensation    PT Next Visit Plan cervical stretching, joint mobilization C-spine, upper T-spine and CT junction, multi-sensory balance, visual motion sensitivity training; assess respones to DN and manual work    PT Home Exercise Plan Access Code: [email or web address]    Consulted and Agree with Plan of Care Patient           Patient will benefit from skilled therapeutic intervention in order to improve the following deficits and impairments:  Decreased balance, Pain,  Hypomobility, Postural dysfunction, Dizziness, Decreased activity tolerance, Impaired vision/preception  Visit Diagnosis: Dizziness and giddiness  Other abnormalities of gait and mobility  BPPV (benign paroxysmal positional vertigo), right     Problem List Patient Active Problem  List   Diagnosis Date Noted  . Tinnitus 09/14/2019  . Dizzy 09/14/2019  . Post concussion syndrome 07/12/2019  . Crohn's disease of intestine ([place]) 02/24/2005    Oda Placke , PT 12/11/2019, 12:58 PM  [facility] [place], [place], [address] Phone: [phone]   Fax:  [phone]  Name: Petros Ahart MRN: [id number] Date of Birth: [birth year]

## 2019-12-18 ENCOUNTER — Ambulatory Visit (INDEPENDENT_AMBULATORY_CARE_PROVIDER_SITE_OTHER): Payer: 59 | Admitting: Rehabilitative and Restorative Service Providers"

## 2019-12-18 ENCOUNTER — Encounter: Payer: Self-pay | Admitting: Rehabilitative and Restorative Service Providers"

## 2019-12-18 ENCOUNTER — Other Ambulatory Visit: Payer: Self-pay

## 2019-12-18 DIAGNOSIS — R42 Dizziness and giddiness: Secondary | ICD-10-CM

## 2019-12-18 DIAGNOSIS — R2689 Other abnormalities of gait and mobility: Secondary | ICD-10-CM | POA: Diagnosis not present

## 2019-12-18 NOTE — Patient Instructions (Signed)
Access Code: [email or web address] URL: [email or web address] Date: 12/18/2019 Prepared by: Rudell Cobb  Exercises Seated Levator Scapulae Stretch - 2 x daily - 7 x weekly - 1 sets - 3 reps - 20 seconds hold Seated Isometric Cervical Extension - 2 x daily - 7 x weekly - 1 sets - 10 reps Sternocleidomastoid Stretch - 2 x daily - 7 x weekly - 1 sets - 3 reps - 20-30 seconds hold Supine Chest Stretch on Foam Roll - 2 x daily - 7 x weekly - 1 sets - 1 reps - 2-3 minutes hold Supine Thoracic Mobilization Foam Roll Horizontal with Arm Stretch - 2 x daily - 7 x weekly - 1 sets - 1 reps - 2 minutes hold Thoracic Mobilization on Foam Roll - 2 x daily - 7 x weekly - 1 sets - 1 reps Standing High Shoulder Row with Anchored Resistance - 2 x daily - 7 x weekly - 1 sets - 10 reps Doorway Pec Stretch at 90 Degrees Abduction - 2 x daily - 7 x weekly - 1 sets - 3 reps - 30 seconds hold Single Leg Balance with Eyes Closed - 2 x daily - 7 x weekly - 1 sets - 3 reps - 10 seconds hold Standing VOR Cancellation - 2 x daily - 7 x weekly - 1 sets - 10-15 reps Supine Passive Cervical Extension - 2 x daily - 7 x weekly - 1 sets - 3 reps - 10-15 seconds hold

## 2019-12-18 NOTE — Therapy (Signed)
[place], [place], [address] Phone: [phone]   Fax:  [phone]  Physical Therapy Treatment and On Hold Note  Patient Details  Name: Phillip Mcclain MRN: [id number] Date of Birth: Mar 29, 1991 Referring Provider (PT): [name], MD   Encounter Date: 12/18/2019   PT End of Session - 12/18/19 1234    Visit Number 4    Number of Visits 6    Date for PT Re-Evaluation 01/08/20    PT Start Time 0804    PT Stop Time 0846    PT Time Calculation (min) 42 min    Activity Tolerance Patient tolerated treatment well           Past Medical History:  Diagnosis Date  . Crohn's disease ([place])   . Crohn's disease of intestine ([place]) 02/24/2005  . Dizzy 09/14/2019   Postconcussion syndrome spring 2021  . Tinnitus 09/14/2019   Following concussion 2021    History reviewed. No pertinent surgical history.  There were no vitals filed for this visit.   Subjective Assessment - 12/18/19 0804    Subjective The patient had a flare up of his neck after last visit.  He felt the chin tuck into the ball (isometric retraction) flared up pain + working under a house at work + some stress.  He went to a chiropractor last week and got adjustments that seemed to help for several days.  The chiropractor had him perform smooth pursuits and noted a saccade with the left eye.  He also did some imaginary target training with chiropractor.  He woke with pain in the suboccipital region last night.    We discussed sleeping positions for home (see self care).    Patient Stated Goals reduce daily symptoms    Currently in Pain? Yes    Pain Score 3    to touch on the neck; no pain without palpation   Pain Location Neck    Pain Orientation Right    Pain Descriptors / Indicators --   stiffness   Pain Onset More than a month ago    Pain Frequency Intermittent    Aggravating Factors  stiffness; pain with palpation    Pain Relieving Factors  varies              [facility] PT Assessment - 12/18/19 1235      Assessment   Medical Diagnosis post concussion syndrome    Referring Provider (PT) [name], MD    Onset Date/Surgical Date 04/27/19               Vestibular Assessment - 12/18/19 1238      Oculomotor Exam   Smooth Pursuits Intact   patient has symmetrical eye movement                   [facility] Adult PT Treatment/Exercise - 12/18/19 0811      Self-Care   Self-Care Other Self-Care Comments    Other Self-Care Comments  The patient jogged for > 10 minutes without symptoms last night.  We discussd sleeping on his stomach and alternative positions to reduce neck strain.    He has been wearing contacts for 3.5 weeks.  He wonders if contacts are contributing to the headaches.       Neuro Re-ed    Neuro Re-ed Details  Reviewed single limb stance with eyes closed and patient improved with instruction to transition to SLS and then close eyes to gain balance before  changing visual input.      Exercises   Exercises Neck      Neck Exercises: Seated   Cervical Isometrics Extension;5 secs;5 reps    Other Seated Exercise reviewed SCM stretching and modified technique to reduce discomfort      Neck Exercises: Supine   Neck Retraction 5 reps    Neck Retraction Limitations increases discomfort    Capital Flexion 5 reps    Other Supine Exercise end range PROM flexion and passive extension with head off end of mat table.      Manual Therapy   Manual Therapy Soft tissue mobilization;Joint mobilization    Manual therapy comments to reduce end range stiffness    Joint Mobilization PA C2-C3 grade II    Soft tissue mobilization STM and ischemic compression R suboccipitals and L scalenes           Vestibular Treatment/Exercise - 12/18/19 0817      Vestibular Treatment/Exercise   Gaze Exercises Eye/Head Exercise Horizontal      Eye/Head Exercise Horizontal   Foot Position smooth pursuits in seated position     Comments no saccades noted;  we discussed conjugate gaze and exercises performed at chiropractor                 PT Education - 12/18/19 1234    Education Details modified HEP    Person(s) Educated Patient    Methods Explanation;Demonstration;Handout    Comprehension Verbalized understanding;Returned demonstration               PT Long Term Goals - 12/18/19 0819      PT LONG TERM GOAL #1   Title The patient will be indep with HEP.    Time 6    Period Weeks    Status Achieved    Target Date 01/08/20      PT LONG TERM GOAL #2   Title The patient will report dec'd HA frequency from daily to < or equal to 2x/week.    Time 6    Period Weeks    Status Not Met      PT LONG TERM GOAL #3   Title The patient will perform cervical AROM without c/o neck tightness.    Time 6    Period Weeks    Status Achieved      PT LONG TERM GOAL #4   Title The patient will tolerate scrolling on his phone without c/o headache/head tightness.    Baseline varies depending on the day and symptom levels    Time 6    Period Weeks    Status Partially Met      PT LONG TERM GOAL #5   Title The patient will return to recreational tasks (paintball) without HA or neck pain.    Baseline has not returned to sport    Time 6    Period Weeks    Status Not Met                 Plan - 12/18/19 1239    Clinical Impression Statement The patient has partially met LTGs. He continues with HA regularly and has not returned to sport (paintball) for competition.  He did return to jogging this week with some success. PT recommended he work on current HEP and return to PT in 2-3 weeks if needed.  He plans to seek further joint manipulation at [facility]rioproactic clinic, therefore will wait to see how he responds before modifying program here.    Rehab  Potential Good    PT Frequency 1x / week    PT Duration 6 weeks    PT Treatment/Interventions ADLs/Self Care Home Management;Vestibular;Canalith  Repostioning;Neuromuscular re-education;Balance training;Manual techniques;Therapeutic exercise;Dry needling;Taping;Patient/family education;Gait training;Functional mobility training;Therapeutic activities;Visual/perceptual remediation/compensation    PT Next Visit Plan cervical stretching, joint mobilization C-spine, upper T-spine and CT junction, multi-sensory balance, visual motion sensitivity training; assess respones to DN and manual work    PT Home Exercise Plan Access Code: [email or web address]    Consulted and Agree with Plan of Care Patient           Patient will benefit from skilled therapeutic intervention in order to improve the following deficits and impairments:  Decreased balance, Pain, Hypomobility, Postural dysfunction, Dizziness, Decreased activity tolerance, Impaired vision/preception  Visit Diagnosis: Dizziness and giddiness  Other abnormalities of gait and mobility     Problem List Patient Active Problem List   Diagnosis Date Noted  . Tinnitus 09/14/2019  . Dizzy 09/14/2019  . Post concussion syndrome 07/12/2019  . Crohn's disease of intestine ([place]) 02/24/2005    Mekaylah Klich , PT 12/18/2019, 12:44 PM  [facility] [place], [place], [address] Phone: [phone]   Fax:  [phone]  Name: Phillip Mcclain MRN: [id number] Date of Birth: [birth year]

## 2020-01-04 ENCOUNTER — Telehealth: Payer: Self-pay | Admitting: Family Medicine

## 2020-01-04 NOTE — Telephone Encounter (Signed)
Returned pt's call and informed him that I have printed a copy of his ImPact test results and left them up front.  He is welcome to come get them whenever it's convenient for him.

## 2020-01-04 NOTE — Telephone Encounter (Signed)
Patient called asking if he would be able to received a copy of his impact testing results from February.

## 2020-01-05 ENCOUNTER — Ambulatory Visit (INDEPENDENT_AMBULATORY_CARE_PROVIDER_SITE_OTHER): Payer: 59 | Admitting: Rehabilitative and Restorative Service Providers"

## 2020-01-05 ENCOUNTER — Encounter: Payer: Self-pay | Admitting: Rehabilitative and Restorative Service Providers"

## 2020-01-05 ENCOUNTER — Other Ambulatory Visit: Payer: Self-pay

## 2020-01-05 DIAGNOSIS — R42 Dizziness and giddiness: Secondary | ICD-10-CM | POA: Diagnosis not present

## 2020-01-05 DIAGNOSIS — R2689 Other abnormalities of gait and mobility: Secondary | ICD-10-CM | POA: Diagnosis not present

## 2020-01-05 NOTE — Therapy (Addendum)
[place], [place], [address] Phone: [phone]   Fax:  [phone]  Physical Therapy Treatment and On-Hold NOte/ Discharge Summary  Patient Details  Name: Roper Tolson MRN: [id number] Date of Birth: 02/23/1991 Referring Provider (PT): [name], MD   Encounter Date: 01/05/2020   PT End of Session - 01/05/20 0937    Visit Number 5    Number of Visits 6    Date for PT Re-Evaluation 01/08/20    PT Start Time 0931    PT Stop Time 1009    PT Time Calculation (min) 38 min    Activity Tolerance Patient tolerated treatment well    Behavior During Therapy [facility] for tasks assessed/performed           Past Medical History:  Diagnosis Date  . Crohn's disease ([place])   . Crohn's disease of intestine ([place]) 02/24/2005  . Dizzy 09/14/2019   Postconcussion syndrome spring 2021  . Tinnitus 09/14/2019   Following concussion 2021    History reviewed. No pertinent surgical history.  There were no vitals filed for this visit.   Subjective Assessment - 01/05/20 0932    Subjective The patient reports he is having really good days most days and then a few bad days in between.  He went white water rafting and got signifivcant symptoms (head tightness, dizziness).  He was able to lay down and let symptoms settle.  He is able to return to jogging without difficulty.  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  He got a new prescription for contact lenses that he is testing out.  He has had more migraine this week (may be related to eye exam, phone scrolling, etc).  The neck is "mostly good most of the time", but it does flare occasionally for a day.    Patient Stated Goals reduce daily symptoms    Currently in Pain? No/denies              [facility] PT Assessment - 01/05/20 1304      Assessment   Medical Diagnosis post concussion syndrome    Referring Provider (PT) [name], MD    Onset Date/Surgical Date 04/27/19                         [facility] Adult PT Treatment/Exercise - 01/05/20 5465      Self-Care   Self-Care Other Self-Care Comments    Other Self-Care Comments  discussed post d/c plan, discussed what is limiting him day to day (he feels unsure of what is causing it) and discussed having to manage it during the work day.  He has been running and is hesitant to return to full paint ball due to intermittent symptoms.  He is planning to do drills this weekend with contacts in to see how he does.    PT and patient discussed PT interventions and modified HEP for neck and visual/vestibular/ocular symptoms      Exercises   Exercises Neck      Neck Exercises: Seated   Cervical Isometrics Extension;5 secs           Vestibular Treatment/Exercise - 01/05/20 1013      Vestibular Treatment/Exercise   Vestibular Treatment Provided Gaze    Gaze Exercises X1 Viewing Horizontal;Eye/Head Exercise Horizontal      X1 Viewing Horizontal   Foot Position standing/ discussed  return to performing this activity with busy background     Comments 2 minutes with metronome for cues on speed desired for exercise and discussed range of motion      Eye/Head Exercise Horizontal   Foot Position eye/head coordination increasing speed    Comments recommended 10-15 times for home program                 PT Education - 01/05/20 1000    Education Details modified HEP to accommodate current symptoms    Person(s) Educated Patient    Methods Explanation;Demonstration;Handout    Comprehension Returned demonstration;Verbalized understanding               PT Long Term Goals - 01/05/20 1941      PT LONG TERM GOAL #1   Title The patient will be indep with HEP.    Time 6    Period Weeks    Status Achieved      PT LONG TERM GOAL #2   Title The patient will report dec'd HA frequency from daily to < or equal to 2x/week.    Time 6    Period Weeks     Status Not Met      PT LONG TERM GOAL #3   Title The patient will perform cervical AROM without c/o neck tightness.    Time 6    Period Weeks    Status Achieved      PT LONG TERM GOAL #4   Title The patient will tolerate scrolling on his phone without c/o headache/head tightness.    Baseline varies depending on the day and symptom levels    Time 6    Period Weeks    Status Partially Met      PT LONG TERM GOAL #5   Title The patient will return to recreational tasks (paintball) without HA or neck pain.    Baseline has not returned to sport    Time 6    Period Weeks    Status Not Met                 Plan - 01/05/20 0936    Clinical Impression Statement The patient returned today after trying HEP x 2-3 weeks on his own.  He notes that occasional head tightness and migraine appear to be most limiting symptom.  He asked for advice on meds and PT noted we do not prescribe, but maybe monitoring s ymptoms over a month to determine if HA is frequently hindering participation in activities-- if so, he may want to f/u with MD fo rmedical mgmt.    Rehab Potential Good    PT Frequency 1x / week    PT Duration 6 weeks    PT Treatment/Interventions ADLs/Self Care Home Management;Vestibular;Canalith Repostioning;Neuromuscular re-education;Balance training;Manual techniques;Therapeutic exercise;Dry needling;Taping;Patient/family education;Gait training;Functional mobility training;Therapeutic activities;Visual/perceptual remediation/compensation    PT Next Visit Plan cervical stretching, joint mobilization C-spine, upper T-spine and CT junction, multi-sensory balance, visual motion sensitivity training; assess respones to DN and manual work    PT Home Exercise Plan Access Code: [email or web address]    Consulted and Agree with Plan of Care Patient           Patient will benefit from skilled therapeutic intervention in order to improve the following deficits and impairments:  Decreased balance,  Pain, Hypomobility, Postural dysfunction, Dizziness, Decreased activity tolerance, Impaired vision/preception  Visit Diagnosis: Dizziness and giddiness  Other abnormalities of gait and mobility     Problem List Patient Active  Problem List   Diagnosis Date Noted  . Tinnitus 09/14/2019  . Dizzy 09/14/2019  . Post concussion syndrome 07/12/2019  . Crohn's disease of intestine ([place]) 02/24/2005    PHYSICAL THERAPY DISCHARGE SUMMARY  Visits from Start of Care: 5  Current functional level related to goals / functional outcomes: See goals above   Remaining deficits: Intermittent symptoms   Education / Equipment: HEP  Plan: Patient agrees to discharge.  Patient goals were partially met. Patient is being discharged due to meeting the stated rehab goals.  ?????         Thank you for the referral of this patient. Rudell Cobb, MPT  [place] , PT 01/05/2020, 1:05 PM  [facility] [place], [place], [address] Phone: [phone]   Fax:  [phone]  Name: Jac Romulus MRN: [id number] Date of Birth: [birth year]

## 2020-01-05 NOTE — Patient Instructions (Signed)
Access Code: [email or web address] URL: [email or web address] Date: 01/05/2020 Prepared by: Rudell Cobb  Program Notes JOGGING:  Try to return to sport increasing jogging and performance of drills to return to paint ball.   NECK:  Do neck exercises as needed.    Exercises Standing Gaze Stabilization with Two Near Targets and Head Rotation - 2 x daily - 7 x weekly - 1 sets - 10 reps Standing VOR Cancellation - 2 x daily - 7 x weekly - 1 sets - 10-15 reps Standing Gaze Stabilization with Head Rotation - 2 x daily - 7 x weekly - 2 sets - 1 reps - 2 minutes hold Single Leg Balance with Eyes Closed - 2 x daily - 7 x weekly - 1 sets - 3 reps - 10 seconds hold Seated Levator Scapulae Stretch - 2 x daily - 7 x weekly - 1 sets - 3 reps - 20 seconds hold Seated Isometric Cervical Extension - 2 x daily - 7 x weekly - 1 sets - 10 reps

## 2020-04-29 ENCOUNTER — Ambulatory Visit (INDEPENDENT_AMBULATORY_CARE_PROVIDER_SITE_OTHER): Payer: 59 | Admitting: Family Medicine

## 2020-04-29 ENCOUNTER — Other Ambulatory Visit: Payer: Self-pay

## 2020-04-29 ENCOUNTER — Encounter: Payer: Self-pay | Admitting: Family Medicine

## 2020-04-29 VITALS — BP 100/62 | HR 104 | Temp 98.2°F | Ht 65.0 in | Wt 114.2 lb

## 2020-04-29 DIAGNOSIS — R0789 Other chest pain: Secondary | ICD-10-CM

## 2020-04-29 DIAGNOSIS — R058 Other specified cough: Secondary | ICD-10-CM | POA: Diagnosis not present

## 2020-04-29 MED ORDER — MELOXICAM 15 MG PO TABS: 15.0000 mg | ORAL_TABLET | Freq: Every day | ORAL | 0 refills | Status: DC

## 2020-04-29 MED ORDER — BENZONATATE 100 MG PO CAPS: 100.0000 mg | ORAL_CAPSULE | Freq: Three times a day (TID) | ORAL | 0 refills | Status: DC | PRN

## 2020-04-29 NOTE — Patient Instructions (Signed)
Heat (pad or rice pillow in microwave) over affected area, 10-15 minutes twice daily.   OK to take Tylenol 1000 mg (2 extra strength tabs) or 975 mg (3 regular strength tabs) every 6 hours as needed.  Let me know if there are any issues with the medicine.  No NSAIDs (Aleve, Advil, ibuprofen, Motrin, etc) while on the meloxicam/Mobic.  Let us know if you need anything.   Pectoralis Major Rehab Ask your health care provider which exercises are safe for you. Do exercises exactly as told by your health care provider and adjust them as directed. It is normal to feel mild stretching, pulling, tightness, or discomfort as you do these exercises, but you should stop right away if you feel sudden pain or your pain gets worse.Do not begin these exercises until told by your health care provider. Stretching and range of motion exercises These exercises warm up your muscles and joints and improve the movement and flexibility of your shoulder. These exercises can also help to relieve pain, numbness, and tingling. Exercise A: Pendulum  1. Stand near a wall or a surface that you can hold onto for balance. 2. Bend at the waist and let your left / right arm hang straight down. Use your other arm to keep your balance. 3. Relax your arm and shoulder muscles, and move your hips and your trunk so your left / right arm swings freely. Your arm should swing because of the motion of your body, not because you are using your arm or shoulder muscles. 4. Keep moving so your arm swings in the following directions, as told by your health care provider: ? Side to side. ? Forward and backward. ? In clockwise and counterclockwise circles. 5. Slowly return to the starting position. Repeat 2 times. Complete this exercise 3 times per week. Exercise B: Abduction, standing 1. Stand and hold a broomstick, a cane, or a similar object. Place your hands a little more than shoulder-width apart on the object. Your left / right hand  should be palm-up, and your other hand should be palm-down. 2. While keeping your elbow straight and your shoulder muscles relaxed, push the stick across your body toward your left / right side. Raise your left / right arm to the side of your body and then over your head until you feel a stretch in your shoulder. ? Stop when you reach the angle that is recommended by your health care provider. ? Avoid shrugging your shoulder while you raise your arm. Keep your shoulder blade tucked down toward the middle of your spine. 3. Hold for 10 seconds. 4. Slowly return to the starting position. Repeat 2 times. Complete this exercise 3 times per week. Exercise C: Wand flexion, supine  1. Lie on your back. You may bend your knees for comfort. 2. Hold a broomstick, a cane, or a similar object so that your hands are about shoulder-width apart on the object. Your palms should face toward your feet. 3. Raise your left / right arm in front of your face, then behind your head (toward the floor). Use your other hand to help you do this. Stop when you feel a gentle stretch in your shoulder, or when you reach the angle that is recommended by your health care provider. 4. Hold for 3 seconds. 5. Use the broomstick and your other arm to help you return your left / right arm to the starting position. Repeat 2 times. Complete this exercise 3 times per week. Exercise D: Wand  shoulder external rotation 1. Stand and hold a broomstick, a cane, or a similar object so your handsare about shoulder-width apart on the object. 2. Start with your arms hanging down, then bend both elbows to an "L" shape (90 degrees). 3. Keep your left / right elbow at your side. Use your other hand to push the stick so your left / right forearm moves away from your body, out to your side. ? Keep your left / right elbow bent to 90 degrees and keep it against your side. ? Stop when you feel a gentle stretch in your shoulder, or when you reach the  angle recommended by your health care provider. 4. Hold for 10 seconds. 5. Use the stick to help you return your left / right arm to the starting position. Repeat 2 times. Complete this exercise 3 times per week. Strengthening exercises These exercises build strength and endurance in your shoulder. Endurance is the ability to use your muscles for a long time, even after your muscles get tired. Exercise E: Scapular protraction, standing 1. Stand so you are facing a wall. Place your feet about one arm-length away from the wall. 2. Place your hands on the wall and straighten your elbows. 3. Keep your hands on the wall as you push your upper back away from the wall. You should feel your shoulder blades sliding forward.Keep your elbows and your head still. ? If you are not sure that you are doing this exercise correctly, ask your health care provider for more instructions. 4. Hold for 3 seconds. 5. Slowly return to the starting position. Let your muscles relax completely before you repeat this exercise. Repeat 2 times. Complete this exercise 3 times per week. Exercise F: Shoulder blade squeezes  (scapular retraction) 1. Sit with good posture in a stable chair. Do not let your back touch the back of the chair. 2. Your arms should be at your sides with your elbows bent. You may rest your forearms on a pillow if that is more comfortable. 3. Squeeze your shoulder blades together. Bring them down and back. ? Keep your shoulders level. ? Do not lift your shoulders up toward your ears. 4. Hold for 3 seconds. 5. Return to the starting position. Repeat 2 times. Complete this exercise 3 times per week. This information is not intended to replace advice given to you by your health care provider. Make sure you discuss any questions you have with your health care provider. Document Released: 05/04/2005 Document Revised: 02/13/2016 Document Reviewed: 01/20/2015 Elsevier Interactive Patient Education  [employer].

## 2020-04-29 NOTE — Progress Notes (Signed)
Musculoskeletal Exam  Patient: Phillip Mcclain DOB: 07/19/1990  DOS: 04/29/2020  SUBJECTIVE:  Chief Complaint:   Chief Complaint  Patient presents with   rib pain    Rib pain               Phillip Mcclain is a 29 y.o.  male for evaluation and treatment of R chest wall pain.   Onset:  1 week ago. No inj or change in activity He got Covid the week before Thanksgiving and has been dealing with a lingering cough since then..  Location: R sided chest Character:  aching and sharp  Progression of issue:  is unchanged Associated symptoms: worse when he coughs or takes a deep breath Treatment: to date has been acetaminophen and heat.   Neurovascular symptoms: no  Past Medical History:  Diagnosis Date   Crohn's disease ([place])    Crohn's disease of intestine ([place]) 02/24/2005   Dizzy 09/14/2019   Postconcussion syndrome spring 2021   Tinnitus 09/14/2019   Following concussion 2021    Objective: VITAL SIGNS: BP 100/62 (BP Location: Left Arm, Patient Position: Sitting, Cuff Size: Normal)    Pulse (!) 104    Temp 98.2 F (36.8 C) (Oral)    Ht 5' 5"  (1.651 m)    Wt 114 lb 4 oz (51.8 kg)    SpO2 98%    BMI 19.01 kg/m  Constitutional: Well formed, well developed. No acute distress. Thorax & Lungs: CTAB.  No accessory muscle use Musculoskeletal: Right chest wall.   Tenderness to palpation: Yes, over the anterior axillary line around rib 6 through 9 Deformity: no Ecchymosis: no Heart: RRR (HR of 72) Psychiatric: Normal mood. Age appropriate judgment and insight. Alert & oriented x 3.    Assessment:  Post-viral cough syndrome - Plan: benzonatate (TESSALON) 100 MG capsule  Chest wall pain - Plan: meloxicam (MOBIC) 15 MG tablet  Plan: Stretches/exercises for the pec, heat, ice, Tylenol.  Meloxicam daily as needed.  I think we can control his cough, his chest wall pain will improve.  Tessalon Perles.  He is very sensitive to medications so will use syrups as last resort. F/u as  originally scheduled. The patient voiced understanding and agreement to the plan.   [place], DO 04/29/20  9:49 AM

## 2020-10-29 DIAGNOSIS — Z8616 Personal history of COVID-19: Secondary | ICD-10-CM | POA: Diagnosis not present

## 2020-10-29 DIAGNOSIS — K5000 Crohn's disease of small intestine without complications: Secondary | ICD-10-CM | POA: Diagnosis not present

## 2021-04-29 NOTE — Progress Notes (Signed)
   I, Peterson Lombard, LAT, ATC acting as a scribe for [name], MD.  Phillip Mcclain is a 30 y.o. male who presents to [place] at [facility] today for R knee pain.  He was last seen by Dr. Georgina Snell on 11/02/19 for f/u of post-concussion syndrome.  Today, pt reports R knee pain x 1 month. Pt plays competitive paintball, but doesn't recall a specific mechanism, but noticed the pain after playing in a tournament.  He locates his pain to lateral aspect of the R knee w/ tightness in the posterior aspect. Pt c/o increased pain when sitting with her L leg crossed and when trying to take his shoe off.  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   MSK: Right knee normal-appearing Normal motion. Tender palpation along distal lateral hamstring tendon. Stable to commence exam. Negative McMurray's test. Pain with resisted knee flexion. Pain with heel lift off test ( Shoe Removal Maneuver popliteus test).  Negative H test Normal gait. Strength intact otherwise.    Lab and Radiology Results  X-ray images right knee obtained today personally and independently interpreted No acute fractures.  Normal-appearing knee x-ray. Await formal radiology review     Assessment and Plan: 30 y.o. male with pain at the posterior lateral corner of the knee ongoing for about a month.  Pain occurred after a competitive paintball match but occurred without an acute injury.  Clinically patient most likely has a distal lateral hamstring strain and a popliteus tendon or muscle strain.  He should  benefit from home exercise program taught in clinic today by ATC.  If this is not sufficient physical therapy would be helpful.  Still if not better MRI would be diagnostic.  Differential does include a posterior lateral meniscus injury which is less likely. Recheck in about a month.  Discussed medication dosing.   PDMP not reviewed this encounter. Orders Placed This Encounter  Procedures   DG Knee AP/LAT W/Sun[place] Right    Standing Status:   Future    Number of Occurrences:   1    Standing Expiration Date:   04/30/2022    Order Specific Question:   Reason for Exam (SYMPTOM  OR DIAGNOSIS REQUIRED)    Answer:   right knee pain    Order Specific Question:   Preferred imaging location?    Answer:   [facility]   No orders of the defined types were placed in this encounter.    Discussed warning signs or symptoms. Please see discharge instructions. Patient expresses understanding.   The above documentation has been reviewed and is accurate and complete [name], M.D.

## 2021-04-30 ENCOUNTER — Ambulatory Visit (INDEPENDENT_AMBULATORY_CARE_PROVIDER_SITE_OTHER): Payer: BC Managed Care – PPO

## 2021-04-30 ENCOUNTER — Encounter: Payer: Self-pay | Admitting: Family Medicine

## 2021-04-30 ENCOUNTER — Other Ambulatory Visit: Payer: Self-pay

## 2021-04-30 ENCOUNTER — Ambulatory Visit (INDEPENDENT_AMBULATORY_CARE_PROVIDER_SITE_OTHER): Payer: BC Managed Care – PPO | Admitting: Family Medicine

## 2021-04-30 VITALS — BP 116/78 | HR 81 | Ht 65.0 in | Wt 114.2 lb

## 2021-04-30 DIAGNOSIS — M25561 Pain in right knee: Secondary | ICD-10-CM

## 2021-04-30 DIAGNOSIS — G8929 Other chronic pain: Secondary | ICD-10-CM

## 2021-04-30 NOTE — Patient Instructions (Addendum)
Thank you for coming in today.   Please complete the exercises that the athletic trainer went over with you:  View at [email or web address] using code: [email or web address]  Please get an Xray today before you leave   Recheck back in 1 month, especially if not better

## 2021-05-01 NOTE — Progress Notes (Signed)
Right knee x-ray looks normal to radiology

## 2021-05-15 DIAGNOSIS — K5000 Crohn's disease of small intestine without complications: Secondary | ICD-10-CM | POA: Diagnosis not present

## 2021-06-30 ENCOUNTER — Other Ambulatory Visit: Payer: Self-pay

## 2021-06-30 ENCOUNTER — Ambulatory Visit (INDEPENDENT_AMBULATORY_CARE_PROVIDER_SITE_OTHER): Payer: BC Managed Care – PPO | Admitting: Family Medicine

## 2021-06-30 ENCOUNTER — Ambulatory Visit (INDEPENDENT_AMBULATORY_CARE_PROVIDER_SITE_OTHER): Payer: BC Managed Care – PPO

## 2021-06-30 VITALS — BP 128/86 | HR 80 | Ht 65.0 in | Wt 115.2 lb

## 2021-06-30 DIAGNOSIS — M79672 Pain in left foot: Secondary | ICD-10-CM

## 2021-06-30 DIAGNOSIS — S92502A Displaced unspecified fracture of left lesser toe(s), initial encounter for closed fracture: Secondary | ICD-10-CM | POA: Diagnosis not present

## 2021-06-30 DIAGNOSIS — M2022 Hallux rigidus, left foot: Secondary | ICD-10-CM | POA: Diagnosis not present

## 2021-06-30 DIAGNOSIS — S62617A Displaced fracture of proximal phalanx of left little finger, initial encounter for closed fracture: Secondary | ICD-10-CM | POA: Diagnosis not present

## 2021-06-30 NOTE — Progress Notes (Signed)
I, Peterson Lombard, LAT, ATC acting as a scribe for [name], MD.  Derold Dorsch is a 31 y.o. male who presents to [place] at [facility] today for L 5th toe ongoing for 2-3 weeks. Pt was walking barefoot and jammed his L 5th toe on a piece of furniture. Pt was previously seen by Dr. Georgina Snell on 04/30/21 for chronic R knee pain. Today, pt locates pain to the webspace between the 4th and 5th toes. Pt notes pain and swelling have improved. Increased pain w/ wearing a shoe/boot esp w/ activity.  He notes originally the fifth toe area was black and blue and very swollen after the injury.  The bruising and swelling has reduced quite a bit.  Additionally he notes his great toe on his left foot is painful as well.  He developed a bit of a bunion and notes that he cannot fully dorsiflex that toe.  Swelling: No- was prior, w/ ecchymosis Aggravates: toe flexion Treatments: none   Pertinent review of systems: No fevers or chills  Relevant historical information: Competitive Retail banker.   Exam:  BP 128/86    Pulse 80    Ht 5' 5"  (1.651 m)    Wt 115 lb 3.2 oz (52.3 kg)    SpO2 98%    BMI 19.17 kg/m  General: Well Developed, well nourished, and in no acute distress.   MSK: Left foot slight swelling at fifth toe.  Mildly tender palpation at the fifth toe. Some pain with motion of the toe. First toe small bunion formation present.  Decreased range of motion to the great toe dorsiflexion.  Mildly tender to palpation.    Lab and Radiology Results No results found for this or any previous visit (from the past 72 hour(s)). DG Foot Complete Left  Result Date: 06/30/2021 CLINICAL DATA:  Left fifth digit foot pain for 2-3 weeks after hitting it. EXAM: LEFT FOOT - COMPLETE 3+ VIEW COMPARISON:  None. FINDINGS: Acute nondisplaced intra-articular fracture involving head of the fifth proximal phalanx. No subluxation. No radiopaque foreign body IMPRESSION: Acute nondisplaced  intra-articular fracture involving the head of fifth proximal phalanx. These results will be called to the ordering clinician or representative by the Radiologist Assistant, and communication documented in the PACS or [employer]. Electronically Signed   By: Donavan Foil M.D.   On: 06/30/2021 16:53    I, [name], personally (independently) visualized and performed the interpretation of the images attached in this note.    Assessment and Plan: 31 y.o. male with left fifth toe fracture.  Plan for buddy tape and activity restriction as needed.  Resume activity as tolerated with buddy taping.  Expect that he will require buddy taping for about 3 weeks.  Discussed that we certainly could recheck this with x-ray in the future but if the fifth toe fracture and if he is feeling fine I do not think it is necessary.  He agrees.  Additionally he has some pain at the first toe that is due to hallux rigidus.  He does have a little bit of degenerative changes on that x-ray per my interpretation at the first toe.  Discussed that he may need to modify how he is positioning himself while playing paintball.  A turf toe insole could be helpful if he is having more pain.   PDMP not reviewed this encounter. Orders Placed This Encounter  Procedures   DG Foot Complete Left    Standing Status:   Future  Number of Occurrences:   1    Standing Expiration Date:   06/30/2022    Order Specific Question:   Reason for Exam (SYMPTOM  OR DIAGNOSIS REQUIRED)    Answer:   left fooot pain    Order Specific Question:   Preferred imaging location?    Answer:   [facility]   No orders of the defined types were placed in this encounter.    Discussed warning signs or symptoms. Please see discharge instructions. Patient expresses understanding.   The above documentation has been reviewed and is accurate and complete [name], M.D.

## 2021-06-30 NOTE — Patient Instructions (Addendum)
Thank you for coming in today.   Buddy tape the toe  Recheck back as needed

## 2021-07-01 DIAGNOSIS — S92502A Displaced unspecified fracture of left lesser toe(s), initial encounter for closed fracture: Secondary | ICD-10-CM | POA: Insufficient documentation

## 2021-07-01 DIAGNOSIS — M2022 Hallux rigidus, left foot: Secondary | ICD-10-CM | POA: Insufficient documentation

## 2021-07-01 NOTE — Progress Notes (Signed)
Radiology agrees that you did break your fifth toe like I was showing you in clinic.

## 2021-07-03 DIAGNOSIS — K50818 Crohn's disease of both small and large intestine with other complication: Secondary | ICD-10-CM | POA: Diagnosis not present

## 2021-07-03 DIAGNOSIS — K5080 Crohn's disease of both small and large intestine without complications: Secondary | ICD-10-CM | POA: Diagnosis not present

## 2021-08-01 ENCOUNTER — Ambulatory Visit (INDEPENDENT_AMBULATORY_CARE_PROVIDER_SITE_OTHER): Payer: BC Managed Care – PPO | Admitting: Family Medicine

## 2021-08-01 ENCOUNTER — Encounter: Payer: Self-pay | Admitting: Family Medicine

## 2021-08-01 ENCOUNTER — Other Ambulatory Visit: Payer: Self-pay | Admitting: Family Medicine

## 2021-08-01 VITALS — BP 106/64 | HR 81 | Temp 98.0°F | Ht 65.0 in | Wt 111.5 lb

## 2021-08-01 DIAGNOSIS — M25559 Pain in unspecified hip: Secondary | ICD-10-CM

## 2021-08-01 NOTE — Patient Instructions (Signed)
Heat (pad or rice pillow in microwave) over affected area, 10-15 minutes twice daily.  ? ?Ice/cold pack over area for 10-15 min twice daily. ? ?OK to take Tylenol 1000 mg (2 extra strength tabs) or 975 mg (3 regular strength tabs) every 6 hours as needed. ? ?Let us know if you need anything. ? ?Hip Exercises ?It is normal to feel mild stretching, pulling, tightness, or discomfort as you do these exercises, but you should stop right away if you feel sudden pain or your pain gets worse.  ? ?STRETCHING AND RANGE OF MOTION EXERCISES ?These exercises warm up your muscles and joints and improve the movement and flexibility of your hip. These exercises also help to relieve pain, numbness, and tingling. ?Exercise A: Hamstrings, Supine ? ?Lie on your back. ?Loop a belt or towel over the ball of your left / right foot. The ball of your foot is on the walking surface, right under your toes. ?Straighten your left / right knee and slowly pull on the belt to raise your leg. ?Do not let your left / right knee bend while you do this. ?Keep your other leg flat on the floor. ?Raise the left / right leg until you feel a gentle stretch behind your left / right knee or thigh. ?Hold this position for 30 seconds. ?Slowly return your leg to the starting position. ?Repeat2 times. Complete this stretch 3 times per week. ?Exercise B: Hip Rotators ? ?Lie on your back on a firm surface. ?Hold your left / right knee with your left / right hand. Hold your ankle with your other hand. ?Gently pull your left / right knee and rotate your lower leg toward your other shoulder. ?Pull until you feel a stretch in your buttocks. ?Keep your hips and shoulders firmly planted while you do this stretch. ?Hold this position for 30 seconds. ?Repeat 2 times. Complete this stretch 3 times per week. ?Exercise C: V-Sit (Hamstrings and Adductors) ? ?Sit on the floor with your legs extended in a large ?V? shape. Keep your knees straight during this exercise. ?Start  with your head and chest upright, then bend at your waist to reach for your left foot (position A). You should feel a stretch in your right inner thigh. ?Hold this position for 30 seconds. Then slowly return to the upright position. ?Bend at your waist to reach forward (position B). You should feel a stretch behind both of your thighs and knees. ?Hold this position for 30 seconds. Then slowly return to the upright position. ?Bend at your waist to reach for your right foot (position C). You should feel a stretch in your left inner thigh. ?Hold this position for 30 seconds. Then slowly return to the upright position. ?Repeat A, B, and C 2 times each. Complete this stretch 3 times per week. ?Exercise D: Lunge (Hip Flexors) ? ?Place your left / right knee on the floor and bend your other knee so that is directly over your ankle. You should be half-kneeling. ?Keep good posture with your head over your shoulders. ?Tighten your buttocks to point your tailbone downward. This helps your back to keep from arching too much. ?You should feel a gentle stretch in the front of your left / right thigh and hip. If you do not feel any resistance, slightly slide your other foot forward and then slowly lunge forward so your knee once again lines up over your ankle. ?Make sure your tailbone continues to point downward. ?Hold this position for 30 seconds. ?  Repeat 2 times. Complete this stretch 3 times per week. ? ?STRENGTHENING EXERCISES ?These exercises build strength and endurance in your hip. Endurance is the ability to use your muscles for a long time, even after they get tired. ?Exercise E: Bridge (Hip Extensors) ? ?Lie on your back on a firm surface with your knees bent and your feet flat on the floor. ?Tighten your buttocks muscles and lift your bottom off the floor until the trunk of your body is level with your thighs. ?Do not arch your back. ?You should feel the muscles working in your buttocks and the back of your thighs. If  you do not feel these muscles, slide your feet 1-2 inches (2.5-5 cm) farther away from your buttocks. ?Hold this position for 3 seconds. ?Slowly lower your hips to the starting position. Repeat for a total of 10 repetitions. ?Let your muscles relax completely between repetitions. ?If this exercise is too easy, try doing it with your arms crossed over your chest. ?Repeat 2 times. Complete this exercise 3 times per week. ?Exercise F: Straight Leg Raises - Hip Abductors ? ?Lie on your side with your left / right leg in the top position. Lie so your head, shoulder, knee, and hip line up with each other. You may bend your bottom knee to help you balance. ?Roll your hips slightly forward, so your hips are stacked directly over each other and your left / right knee is facing forward. ?Leading with your heel, lift your top leg 4-6 inches (10-15 cm). You should feel the muscles in your outer hip lifting. ?Do not let your foot drift forward. ?Do not let your knee roll toward the ceiling. ?Hold this position for 1 second. ?Slowly return to the starting position. ?Let your muscles relax completely between repetitions. Repeat for a total of 10 repetitions.  ?Repeat 2 times. Complete this exercise 3 times per week. ?Exercise G: Straight Leg Raises - Hip Adductors ? ?Lie on your side with your left / right leg in the bottom position. Lie so your head, shoulder, knee, and hip line up. You may place your upper foot in front to help you balance. ?Roll your hips slightly forward, so your hips are stacked directly over each other and your left / right knee is facing forward. ?Tense the muscles in your inner thigh and lift your bottom leg 4-6 inches (10-15 cm). ?Hold this position for 1 second. ?Slowly return to the starting position. ?Let your muscles relax completely between repetitions. Repeat for a total of 10 repetitions. ?Repeat 2 times. Complete this exercise 3 times per week. ?Exercise H: Straight Leg Raises - Quadriceps ? ?Lie  on your back with your left / right leg extended and your other knee bent. ?Tense the muscles in the front of your left / right thigh. When you do this, you should see your kneecap slide up or see increased dimpling just above your knee. ?Tighten these muscles even more and raise your leg 4-6 inches (10-15 cm) off the floor. ?Hold this position for 3 seconds. ?Keep these muscles tense as you lower your leg. ?Relax the muscles slowly and completely between repetitions. Repeat for a total of 10 repetitions. ?Repeat 2 times. Complete this exercise 3 times per week. ?Exercise I: Hip Abductors, Standing ?Tie one end of a rubber exercise band or tubing to a secure surface, such as a table or pole. ?Loop the other end of the band or tubing around your left / right ankle. ?Keeping your ankle with  the band or tubing directly opposite of the secured end, step away until there is tension in the tubing or band. Hold onto a chair as needed for balance. ?Lift your left / right leg out to your side. While you do this: ?Keep your back upright. ?Keep your shoulders over your hips. ?Keep your toes pointing forward. ?Make sure to use your hip muscles to lift your leg. Do not "throw" your leg or tip your body to lift your leg. ?Hold this position for 1 second. ?Slowly return to the starting position. Repeat for a total of 10 repetitions. ?Repeat 2 times. Complete this exercise 3 times per week. ?Exercise J: Squats (Quadriceps) ?Stand in a door frame so your feet and knees are in line with the frame. You may place your hands on the frame for balance. ?Slowly bend your knees and lower your hips like you are going to sit in a chair. ?Keep your lower legs in a straight-up-and-down position. ?Do not let your hips go lower than your knees. ?Do not bend your knees lower than told by your health care provider. ?If your hip pain increases, do not bend as low. ?Hold this position for 1 second. ?Slowly push with your legs to return to standing.  Do not use your hands to pull yourself to standing. Repeat for a total of 10 repetitions. ?Repeat 2 times. Complete this exercise 3 times per week. ?Make sure you discuss any questions you have with your

## 2021-08-01 NOTE — Progress Notes (Signed)
Musculoskeletal Exam ? ?Patient: Phillip Mcclain DOB: Sep 25, 1990 ? ?DOS: 08/01/2021 ? ?SUBJECTIVE: ? ?Chief Complaint:  ? ?Chief Complaint  ?Patient presents with  ? Groin Pain  ?  And down leg  ? ? ?Phillip Mcclain is a 31 y.o.  male for evaluation and treatment of L groin pain.  ? ?Onset:  2 days ago. No inj or change in activity.  ?Location: L groin radiating down inner L thigh ?Character:  aching and sharp ?Progression of issue:  is unchanged ?Associated symptoms: slight bruising ?No swelling, redness, testicular pain, urinary complaints ?Walking and going up stairs notices it more ?Treatment: to date has been none.   ?Neurovascular symptoms: no ? ?Past Medical History:  ?Diagnosis Date  ? Crohn's disease ([place])   ? Crohn's disease of intestine ([place]) 02/24/2005  ? Dizzy 09/14/2019  ? Postconcussion syndrome spring 2021  ? Post concussion syndrome 07/12/2019  ? Tinnitus 09/14/2019  ? Following concussion 2021  ? ? ?Objective: ?VITAL SIGNS: BP 106/64   Pulse 81   Temp 98 ?F (36.7 ?C) (Oral)   Ht 5' 5"  (1.651 m)   Wt 111 lb 8 oz (50.6 kg)   SpO2 97%   BMI 18.55 kg/m?  ?Constitutional: Well formed, well developed. No acute distress. ?Thorax & Lungs: No accessory muscle use ?GU: No bulge or hernia, no ttp over testes b/l ?Musculoskeletal: L hip/groin.   ?Normal active range of motion: yes.   ?Normal passive range of motion: yes ?Tenderness to palpation: medial hip flexor and prox quad ?Deformity: no ?Ecchymosis: yes ?Tests positive: [place] ?Tests negative: FABER, FADDIR, log roll ?Neurologic: Normal sensory function. No focal deficits noted. DTR's equal and symmetric in LE's. No clonus. Gait nml. ?Psychiatric: Normal mood. Age appropriate judgment and insight. Alert & oriented x 3.   ? ?Assessment: ? ?Hip pain ? ?Plan: ?Stretches/exercises, heat, ice, Tylenol. PT if no better.  ?F/u as originally scheduled. ?The patient voiced understanding and agreement to the plan. ? ? ?[place], DO ?08/01/21  ?8:19 AM ? ?

## 2021-08-07 ENCOUNTER — Ambulatory Visit (INDEPENDENT_AMBULATORY_CARE_PROVIDER_SITE_OTHER): Payer: BC Managed Care – PPO | Admitting: Family Medicine

## 2021-08-07 ENCOUNTER — Other Ambulatory Visit: Payer: Self-pay

## 2021-08-07 VITALS — BP 126/82 | HR 64 | Ht 65.0 in | Wt 114.0 lb

## 2021-08-07 DIAGNOSIS — R1032 Left lower quadrant pain: Secondary | ICD-10-CM | POA: Diagnosis not present

## 2021-08-07 DIAGNOSIS — R1031 Right lower quadrant pain: Secondary | ICD-10-CM

## 2021-08-07 NOTE — Progress Notes (Signed)
? ?  I, Wendy Poet, LAT, ATC, am serving as scribe for Dr. [name]. ? ?Nasser Ku is a 31 y.o. male who presents to [place] [facility] today for c/o lower abdominal/groin pain.  He was last seen by Dr. Georgina Snell on 06/30/21 for a L 5th toe fx. Today, pt reports abdominal/groin pain since early March . On 3/5 pt got hit in the scrotum while playing paintball. 3/14 experienced a painful orgasm. He was seen by his PCP for L hip/groin pain c/o on 08/01/21 and has been referred to PT but has not had any visits yet.  He locates his pain to lower abdomin, bilat groin, superior to his penis, and in the URQ and ULQ. Pt notes the pain will vary in location depending on the day. Pt had bruising on the L groin after the injury. Pt is scheduled to start PT next week. ? ?He notes the pain ball hit him in the scrotum but avoided the testicles.  The people impacted the base of the penis area.  This area is still somewhat painful as noted above he has pain into the thigh musculature and into the abdomen a bit now. ? ?Aggravating factors: nothing in particular ?Treatments tried: heat, Tylenol ? ?Pertinent review of systems: No fevers or chills ? ?Relevant historical information: Crohn's disease ? ? ?Exam:  ?BP 126/82   Pulse 64   Ht 5' 5"  (1.651 m)   Wt 114 lb (51.7 kg)   SpO2 100%   BMI 18.97 kg/m?  ?General: Well Developed, well nourished, and in no acute distress.  ? ?MSK: Hip: Normal hip range of motion bilaterally. ?Normal hip strength bilaterally to hip adduction. ?Genital exam: Normal-appearing penis and testicles with no visible bruising. ?Testicles nontender.  No hernia palpated. ? ? ? ? ?Assessment and Plan: ?31 y.o. male with groin pain.  It is possible that he may have developed some pain originating from the origin of the genitofemoral nerve.  It is possible this was bruised as result of the painful impact and now his pain is from some nerve irritation from that.  He also could have some  compensatory muscle imbalance as result of not walking or running normally from his injury that is causing some of his groin or hip pain. ?He has conventional physical therapy scheduled to start soon which I think would be helpful.  If this does not help enough I would recommend next step as a pelvic ultrasound and then referral to pelvic physical therapy.  If that does not help then urology referral should be the next step. ? ?I do not think right now a pelvic ultrasound is going to be helpful but should be done if he cannot get better with conventional physical therapy. ? ? ? ?Discussed warning signs or symptoms. Please see discharge instructions. Patient expresses understanding. ? ? ?The above documentation has been reviewed and is accurate and complete [name], M.D. ? ? ?

## 2021-08-07 NOTE — Patient Instructions (Addendum)
Thank you for coming in today.  ? ?Proceed to the physical therapy that is already ordered and scheduled. ? ?If not getting better I will referred you for a pelvic ultrasound and pelvic PT. ?

## 2021-08-12 ENCOUNTER — Ambulatory Visit: Payer: BC Managed Care – PPO | Attending: Family Medicine | Admitting: Physical Therapy

## 2021-08-12 ENCOUNTER — Other Ambulatory Visit: Payer: Self-pay

## 2021-08-12 ENCOUNTER — Encounter: Payer: Self-pay | Admitting: Physical Therapy

## 2021-08-12 DIAGNOSIS — R29898 Other symptoms and signs involving the musculoskeletal system: Secondary | ICD-10-CM | POA: Diagnosis not present

## 2021-08-12 DIAGNOSIS — M25552 Pain in left hip: Secondary | ICD-10-CM | POA: Insufficient documentation

## 2021-08-12 DIAGNOSIS — M25551 Pain in right hip: Secondary | ICD-10-CM | POA: Diagnosis not present

## 2021-08-12 DIAGNOSIS — M62838 Other muscle spasm: Secondary | ICD-10-CM | POA: Diagnosis not present

## 2021-08-12 DIAGNOSIS — M25559 Pain in unspecified hip: Secondary | ICD-10-CM | POA: Diagnosis not present

## 2021-08-12 NOTE — Patient Instructions (Signed)
? ?  Access Code: [email or web address] ?URL: [email or web address] ?Date: 08/12/2021 ?Prepared by: Annie Paras ? ?Exercises ?- Supine Hamstring Stretch with Strap  - 2-3 x daily - 7 x weekly - 3 reps - 30 sec hold ?- Supine ITB Stretch with Strap  - 2-3 x daily - 7 x weekly - 3 reps - 30 sec hold ?- Hip Adductors and Hamstring Stretch with Strap  - 2-3 x daily - 7 x weekly - 3 reps - 30 sec hold ?- Seated Hip Adductor Stretch  - 2-3 x daily - 7 x weekly - 3 reps - 30 sec hold ?- Supine Quadriceps Stretch with Strap on Table  - 2-3 x daily - 7 x weekly - 3 reps - 30 sec hold ?- Half Kneeling Hip Flexor Stretch  - 2-3 x daily - 7 x weekly - 3 reps - 30 sec hold ? ?

## 2021-08-12 NOTE — Therapy (Signed)
[place] ?[facility] ?[address] ?[place], [place], [address] ?Phone: [phone]   Fax:  [phone] ? ?Physical Therapy Evaluation ? ?Patient Details  ?Name: Phillip Mcclain ?MRN: [id number] ?Date of Birth: 1990-07-27 ?Referring Provider (PT): Riki Sheer, MD ? ? ?Encounter Date: 08/12/2021 ? ? PT End of Session - 08/12/21 0802   ? ? Visit Number 1   ? Number of Visits --   ? Date for PT Re-Evaluation 09/23/21   ? Authorization Type BCBS - VL: 100 (PT/OT/ST)   ? PT Start Time 0802   ? PT Stop Time 8250   ? PT Time Calculation (min) 45 min   ? Activity Tolerance Patient tolerated treatment well   ? Behavior During Therapy [facility] for tasks assessed/performed   ? ?  ?  ? ?  ? ? ?Past Medical History:  ?Diagnosis Date  ? Crohn's disease ([place])   ? Crohn's disease of intestine ([place]) 02/24/2005  ? Dizzy 09/14/2019  ? Postconcussion syndrome spring 2021  ? Post concussion syndrome 07/12/2019  ? Tinnitus 09/14/2019  ? Following concussion 2021  ? ? ?History reviewed. No pertinent surgical history. ? ?There were no vitals filed for this visit. ? ? ? Subjective Assessment - 08/12/21 0805   ? ? Subjective Pt reports a few weeks ago he woke up with pain along his L inner thigh and then it moved to the R leg. Some bruising apparent. ~1 wk prior he had been hit in the privates by a paintball (plays competitive paintball). Pain in legs is moslty gone but now pain extending up into abdominal muscles.   ? Patient Stated Goals "get back to playing paintball and normal activity"   ? Currently in Pain? Yes   ? Pain Score 3    ? Pain Location Groin   ? Pain Orientation Right;Left   R>L  ? Pain Descriptors / Indicators Sore   ? Pain Type Acute pain   ? Pain Radiating Towards extending up into abdominal muscles   ? Pain Onset 1 to 4 weeks ago   ? Pain Frequency Intermittent   ? Aggravating Factors  pulling and lifting   ? Pain Relieving Factors not necessary   ? Effect of Pain on Daily  Activities prevents him from playing paintball, causes him to be cautious when lifting at work   ? ?  ?  ? ?  ? ? ? ? ? [facility] PT Assessment - 08/12/21 0802   ? ?  ? Assessment  ? Medical Diagnosis B groin pain   ? Referring Provider (PT) Riki Sheer, MD   ? Onset Date/Surgical Date 07/30/21   ? Next MD Visit PRN   ? Prior Therapy PT after concussion - vestibular in 2021   ?  ? Precautions  ? Precautions None   ?  ? Restrictions  ? Weight Bearing Restrictions No   ?  ? Balance Screen  ? Has the patient fallen in the past 6 months No   ? Has the patient had a decrease in activity level because of a fear of falling?  No   ? Is the patient reluctant to leave their home because of a fear of falling?  No   ?  ? Home Environment  ? Living Environment Private residence   ?  ? Prior Function  ? Level of Independence Independent   ? Vocation Full time employment   ? 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 Leisure paintball, hiking   ?  ? Cognition  ? Overall Cognitive Status Within Functional Limits for tasks assessed   ?  ? ROM / Strength  ? AROM / PROM / Strength AROM;Strength   ?  ? Strength  ? Strength Assessment Site Hip;Knee   ? Right/Left Hip Right;Left   ? Right Hip Flexion 4+/5   ? Right Hip Extension 5/5   ? Right Hip External Rotation  4+/5   ? Right Hip Internal Rotation 4+/5   ? Right Hip ABduction 4+/5   ? Right Hip ADduction 5/5   ? Left Hip Flexion 4+/5   ? Left Hip Extension 4+/5   ? Left Hip External Rotation 4+/5   ? Left Hip Internal Rotation 4+/5   ? Left Hip ABduction 4/5   ? Left Hip ADduction 5/5   ? Right/Left Knee Right;Left   ? Right Knee Flexion 5/5   ? Right Knee Extension 5/5   ? Left Knee Flexion 5/5   ? Left Knee Extension 5/5   ?  ? Flexibility  ? Soft Tissue Assessment /Muscle Length yes   mild tight B hip flexors and adductors  ? Hamstrings mild tight B   ? Quadriceps WFL   ? ITB WFL   ? Piriformis WFL   ?  ? Palpation  ?  Palpation comment ttp over B proximal adductors, iliopsoas and iliacus   ? ?  ?  ? ?  ? ? ? ? ? ? ? ? ? ? ? ? ? ?Objective measurements completed on examination: See above findings.  ? ? ? ? ? [facility] Adult PT Treatment/Exercise - 08/12/21 0802   ? ?  ? Exercises  ? Exercises Knee/Hip   ?  ? Knee/Hip Exercises: Stretches  ? Passive Hamstring Stretch Right;Left;2 reps;30 seconds   ? Passive Hamstring Stretch Limitations 1 rep each hooklying and supine with strap - better stretch reported with opp LE straight   ? Hip Flexor Stretch Right;Left;1 rep;30 seconds   ? Hip Flexor Stretch Limitations mod thomas with strap & 1/2 kneel lunge   ? ITB Stretch Right;Left;1 rep;30 seconds   ? ITB Stretch Limitations supine crossbody with strap   ? Other Knee/Hip Stretches R/L supine hip adductor stretch with strap & seated side lunge hip adductor stretch x 30 sec each   ? ?  ?  ? ?  ? ? ? ? ? ? ? ? ? ? PT Education - 08/12/21 0845   ? ? Education Details PT eval findings, anticipated POC and initial HEP - Access Code: [email or web address]   ? ?  ?  ? ?  ? ? ? PT Short Term Goals - 08/12/21 0847   ? ?  ? PT SHORT TERM GOAL #1  ? Title Patient will be independent with initial HEP   ? Status New   ? Target Date 09/02/21   ?  ? PT SHORT TERM GOAL #2  ? Title Improve understanding of body mechanics with patient to verbalize and demo correct lifting techniques   ? Status New   ? Target Date 09/02/21   ? ?  ?  ? ?  ? ? ? ? PT Long Term Goals - 08/12/21 0847   ? ?  ? PT LONG TERM GOAL #1  ? Title Patient will be independent with ongoing/advanced HEP for self-management at home   ? Status New   ? Target Date 09/23/21   ?  ?  PT LONG TERM GOAL #2  ? Title Patient to improve tissue quality in B hip flexors and adductors as noted by reduced tissue tightness and tenderness to palpation   ? Status New   ? Target Date 09/23/21   ?  ? PT LONG TERM GOAL #3  ? Title Patient will demonstrate improved B proximal strength to >/= 4+ to 5/5 for improved stability and  ease of mobility   ? Status New   ? Target Date 09/23/21   ?  ? PT LONG TERM GOAL #4  ? Title Patient to report ability to perform work and daily/recreational activities including playing paintball without pain provocation   ? Status New   ? Target Date 09/23/21   ? ?  ?  ? ?  ? ? ? ? ? ? ? ? ? Plan - 08/12/21 0847   ? ? Clinical Impression Statement Phillip Mcclain is a 31 y/o male who presents to OP PT for acute B groin pain. He reports sudden onset in R groin and medial thigh upon waking on the morning of 07/30/21 but notes pain has since shifted to the L groin as well as up into his lower abdomen with pain in the legs mostly resolved. Onset of pain preceded by injury where he was hit in the scrotum by a paint ball on 07/20/21 and reported a painful orgasm on 07/29/21. Deficits include mildly limited proximal flexibility, increased muscle tension and TTP in B proximal hip adductors, iliopsoas and proximal iliacus, and mild proximal LE weakness. 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 Phillip Mcclain will benefit from skilled PT to improve hip/groin muscle flexibility and normalize muscle tension to allow return to normal activities w/o pain interference.   ? Personal Factors and Comorbidities Comorbidity 2;Profession   ? Comorbidities Crohn's disease, h/o concussion   ? Examination-Activity Limitations Squat;Lift   ? Examination-Participation Restrictions Community Activity;Occupation   competitive paintball  ? Stability/Clinical Decision Making Stable/Uncomplicated   ? Clinical Decision Making Low   ? Rehab Potential Good   ? PT Frequency 2x / week   ? PT Duration 6 weeks   ? PT Treatment/Interventions ADLs/Self Care Home Management;Cryotherapy;Electrical Stimulation;Moist Heat;Ultrasound;Functional mobility training;Therapeutic activities;Therapeutic exercise;Neuromuscular re-education;Patient/family  education;Manual techniques;Passive range of motion;Dry needling;Taping   ? PT Next Visit Plan Review initial HEP; initiate lumbopelvic strengthening; lifting & body mechanics; MT +/- DN to B hip adductors and iliacus/

## 2021-08-22 ENCOUNTER — Encounter: Payer: Self-pay | Admitting: Physical Therapy

## 2021-08-22 ENCOUNTER — Ambulatory Visit: Payer: BC Managed Care – PPO | Attending: Family Medicine | Admitting: Physical Therapy

## 2021-08-22 DIAGNOSIS — R279 Unspecified lack of coordination: Secondary | ICD-10-CM | POA: Diagnosis not present

## 2021-08-22 DIAGNOSIS — M62838 Other muscle spasm: Secondary | ICD-10-CM | POA: Diagnosis not present

## 2021-08-22 DIAGNOSIS — M6281 Muscle weakness (generalized): Secondary | ICD-10-CM | POA: Diagnosis not present

## 2021-08-22 DIAGNOSIS — R29898 Other symptoms and signs involving the musculoskeletal system: Secondary | ICD-10-CM | POA: Insufficient documentation

## 2021-08-22 DIAGNOSIS — M25551 Pain in right hip: Secondary | ICD-10-CM | POA: Diagnosis not present

## 2021-08-22 DIAGNOSIS — M25552 Pain in left hip: Secondary | ICD-10-CM | POA: Insufficient documentation

## 2021-08-22 DIAGNOSIS — R293 Abnormal posture: Secondary | ICD-10-CM | POA: Insufficient documentation

## 2021-08-22 NOTE — Therapy (Signed)
[place] ?[facility] ?[address] ?[place], [place], [address] ?Phone: [phone]   Fax:  [phone] ? ?Physical Therapy Treatment ? ?Patient Details  ?Name: Phillip Mcclain ?MRN: [id number] ?Date of Birth: 05-06-1991 ?Referring Provider (PT): Riki Sheer, MD ? ? ?Encounter Date: 08/22/2021 ? ? PT End of Session - 08/22/21 0801   ? ? Visit Number 2   ? Date for PT Re-Evaluation 09/23/21   ? Authorization Type BCBS - VL: 100 (PT/OT/ST)   ? PT Start Time 0801   ? PT Stop Time [phone]   ? PT Time Calculation (min) 42 min   ? Activity Tolerance Patient tolerated treatment well   ? Behavior During Therapy [facility] for tasks assessed/performed   ? ?  ?  ? ?  ? ? ?Past Medical History:  ?Diagnosis Date  ? Crohn's disease ([place])   ? Crohn's disease of intestine ([place]) 02/24/2005  ? Dizzy 09/14/2019  ? Postconcussion syndrome spring 2021  ? Post concussion syndrome 07/12/2019  ? Tinnitus 09/14/2019  ? Following concussion 2021  ? ? ?History reviewed. No pertinent surgical history. ? ?There were no vitals filed for this visit. ? ? Subjective Assessment - 08/22/21 0804   ? ? Subjective Pt reports increased pain in the opp LE (upper leg) hip flexors when he tries the 1/2 kneel hip flexor stretch, but overall feels the stretches have helped.   ? Patient Stated Goals "get back to playing paintball and normal activity"   ? Currently in Pain? Yes   ? Pain Score 4    4-5/10  ? Pain Location Groin   ? Pain Orientation Right;Left   ? Pain Descriptors / Indicators Tightness   ? Pain Type Acute pain   ? Pain Onset --   ? ?  ?  ? ?  ? ? ? ? ? ? ? ? ? ? ? ? ? ? ? ? ? ? ? ? [facility] Adult PT Treatment/Exercise - 08/22/21 0801   ? ?  ? Lumbar Exercises: Supine  ? Ab Set 10 reps;5 seconds   ? Clam 10 reps;3 seconds   ? Clam Limitations green TB alt LE   ? Bent Knee Raise 10 reps;3 seconds   ? Bent Knee Raise Limitations green TB brace march   ? Other Supine Lumbar Exercises TrA + hooklying alt bent knee  fallout 10 x 3"   ?  ? Knee/Hip Exercises: Stretches  ? Hip Flexor Stretch Right;Left;1 rep;30 seconds   ? Hip Flexor Stretch Limitations mod thomas with strap (cues to keep knee below height of table to increase hip flexor stretch) & seated lunge position over edge of chair   deferred 1/2 kneel lunge due to increased pain in opp hip flexors  ? Other Knee/Hip Stretches R/L supine hip adductor stretch with strap, supine butterfly & seated side lunge hip adductor stretch x 30 sec each - pt reporting best stretch with strap   ?  ? Knee/Hip Exercises: Aerobic  ? Recumbent Bike L3 x 6 min   ?  ? Knee/Hip Exercises: Supine  ? Hip Adduction Isometric Both;10 reps;Strengthening   5" hold  ? Hip Adduction Isometric Limitations hooklying ball squeeze   ? Bridges Both;10 reps;Strengthening   ? Bridges Limitations + green TB hip ABD isometric   ?  ? Knee/Hip Exercises: Sidelying  ? Clams R/L green TB clam 10 x 3"   ?  ? Manual Therapy  ? Manual Therapy Soft tissue  mobilization;Myofascial release;Other (comment)   ? Soft tissue mobilization STM/DTM to R iliacus in sidelying   ? Myofascial Release manual TPR to R iliacus   ? Other Manual Therapy instructed pt in self-release techniques with ball on wall or foam roller   ? ?  ?  ? ?  ? ? ? ? ? ? ? ? ? ? PT Education - 08/22/21 0842   ? ? Education Details HEP progression - strengthening exercises + alternative hip flexor stretch   ? Person(s) Educated Patient   ? Methods Explanation;Demonstration;Verbal cues;Handout   ? Comprehension Verbalized understanding;Verbal cues required;Returned demonstration;Need further instruction   ? ?  ?  ? ?  ? ? ? PT Short Term Goals - 08/22/21 0806   ? ?  ? PT SHORT TERM GOAL #1  ? Title Patient will be independent with initial HEP   ? Status On-going   ? Target Date 09/02/21   ?  ? PT SHORT TERM GOAL #2  ? Title Improve understanding of body mechanics with patient to verbalize and demo correct lifting techniques   ? Status On-going   ? Target  Date 09/02/21   ? ?  ?  ? ?  ? ? ? ? PT Long Term Goals - 08/22/21 0806   ? ?  ? PT LONG TERM GOAL #1  ? Title Patient will be independent with ongoing/advanced HEP for self-management at home   ? Status On-going   ? Target Date 09/23/21   ?  ? PT LONG TERM GOAL #2  ? Title Patient to improve tissue quality in B hip flexors and adductors as noted by reduced tissue tightness and tenderness to palpation   ? Status On-going   ? Target Date 09/23/21   ?  ? PT LONG TERM GOAL #3  ? Title Patient will demonstrate improved B proximal strength to >/= 4+ to 5/5 for improved stability and ease of mobility   ? Status On-going   ? Target Date 09/23/21   ?  ? PT LONG TERM GOAL #4  ? Title Patient to report ability to perform work and daily/recreational activities including playing paintball without pain provocation   ? Status On-going   ? Target Date 09/23/21   ? ?  ?  ? ?  ? ? ? ? ? ? ? ? Plan - 08/22/21 0807   ? ? Clinical Impression Statement Phillip Mcclain reports HEP has been mostly helping but does note increased pain in upper leg hip flexors during ? knee hip flexor stretch - modified this to seated lunge position over edge of chair to reduce extreme hip flexion on non-stretch leg with better tolerance reported. Briefly reviewed remaining HEP exercises, addressing pt?s questions and providing minor clarifications. Progressed exercises adding basic lumbopelvic strengthening with good tolerance reported. Attempted manual TPR to R iliacus with good response, therefore discussed possibility of DN to promote further reduction in muscle tension - pt interested in DN but would like to wait until next visit to try this, therefore provided instruction in self-release techniques for hip flexors to try on his own in the meantime.   ? Comorbidities Crohn's disease, h/o concussion   ? Rehab Potential Good   ? PT Frequency 2x / week   ? PT Duration 6 weeks   ? PT Treatment/Interventions ADLs/Self Care Home Management;Cryotherapy;Electrical  Stimulation;Moist Heat;Ultrasound;Functional mobility training;Therapeutic activities;Therapeutic exercise;Neuromuscular re-education;Patient/family education;Manual techniques;Passive range of motion;Dry needling;Taping   ? PT Next Visit Plan MT +/- DN to B hip adductors  and iliacus/iliopsoas as indicated; progress proximal LE flexibilty and lumbopelvic strengthening - HEP updates as indicated; lifting & body mechanics   ? PT Home Exercise Plan Access Code: [email or web address]   ? Consulted and Agree with Plan of Care Patient   ? ?  ?  ? ?  ? ? ?Patient will benefit from skilled therapeutic intervention in order to improve the following deficits and impairments:  Decreased activity tolerance, Decreased strength, Increased fascial restricitons, Increased muscle spasms, Impaired flexibility, Impaired perceived functional ability, Pain ? ?Visit Diagnosis: ?Pain in right hip ? ?Pain in left hip ? ?Other symptoms and signs involving the musculoskeletal system ? ?Other muscle spasm ? ? ? ? ?Problem List ?Patient Active Problem List  ? Diagnosis Date Noted  ? Closed fracture of fifth toe of left foot 07/01/2021  ? Hallux rigidus of left foot 07/01/2021  ? Crohn's disease of intestine ([place]) 02/24/2005  ? ? ?Percival Spanish, PT ?08/22/2021, 8:59 AM ? ?[place] ?[facility] ?[address] ?[place], [place], [address] ?Phone: [phone]   Fax:  [phone] ? ?Name: Phillip Mcclain ?MRN: [id number] ?Date of Birth: 02/08/1991 ? ? ? ?

## 2021-08-22 NOTE — Patient Instructions (Signed)
? ? ?  Access Code: [email or web address] ?URL: [email or web address] ?Date: 08/22/2021 ?Prepared by: Annie Paras ? ?Exercises ?- Supine Hamstring Stretch with Strap  - 2-3 x daily - 7 x weekly - 3 reps - 30 sec hold ?- Supine ITB Stretch with Strap  - 2-3 x daily - 7 x weekly - 3 reps - 30 sec hold ?- Hip Adductors and Hamstring Stretch with Strap  - 2-3 x daily - 7 x weekly - 3 reps - 30 sec hold ?- Seated Hip Adductor Stretch  - 2-3 x daily - 7 x weekly - 3 reps - 30 sec hold ?- Supine Quadriceps Stretch with Strap on Table  - 2-3 x daily - 7 x weekly - 3 reps - 30 sec hold ?- Seated Hip Flexor Stretch  - 2-3 x daily - 7 x weekly - 3 reps - 30 sec hold ?- Bent Knee Fallouts with Alternating Legs  - 1 x daily - 7 x weekly - 2 sets - 10 reps - 3 sec hold ?- Hooklying Single Leg Bent Knee Fallouts with Resistance  - 1 x daily - 7 x weekly - 2 sets - 10 reps - 3 sec hold ?- Supine March with Resistance Band  - 1 x daily - 7 x weekly - 2 sets - 10 reps - 2-3 sec hold hold ?- Supine Bridge with Resistance Band  - 1 x daily - 7 x weekly - 2 sets - 10 reps - 5 sec hold ?- Clamshell with Resistance  - 1 x daily - 7 x weekly - 2 sets - 10 reps - 3 sec hold ?- Hip Flexor Mobilization with Foam Roll  - 1 x daily - 7 x weekly - 1-2 min hold ? ?

## 2021-08-26 ENCOUNTER — Ambulatory Visit: Payer: BC Managed Care – PPO | Admitting: Family Medicine

## 2021-08-26 ENCOUNTER — Encounter: Payer: Self-pay | Admitting: Physical Therapy

## 2021-08-26 ENCOUNTER — Ambulatory Visit: Payer: BC Managed Care – PPO | Admitting: Physical Therapy

## 2021-08-26 DIAGNOSIS — R293 Abnormal posture: Secondary | ICD-10-CM | POA: Diagnosis not present

## 2021-08-26 DIAGNOSIS — M6281 Muscle weakness (generalized): Secondary | ICD-10-CM | POA: Diagnosis not present

## 2021-08-26 DIAGNOSIS — M62838 Other muscle spasm: Secondary | ICD-10-CM | POA: Diagnosis not present

## 2021-08-26 DIAGNOSIS — R29898 Other symptoms and signs involving the musculoskeletal system: Secondary | ICD-10-CM

## 2021-08-26 DIAGNOSIS — M25551 Pain in right hip: Secondary | ICD-10-CM

## 2021-08-26 DIAGNOSIS — M25552 Pain in left hip: Secondary | ICD-10-CM

## 2021-08-26 DIAGNOSIS — R279 Unspecified lack of coordination: Secondary | ICD-10-CM | POA: Diagnosis not present

## 2021-08-26 NOTE — Therapy (Signed)
[facility] ?[place] ?[place], [place], [address] ?Phone: [phone]   Fax:  [phone] ? ?Physical Therapy Treatment ? ?Patient Details  ?Name: Phillip Mcclain ?MRN: [id number] ?Date of Birth: [birth year] ?Referring Provider (PT): Riki Sheer, MD ? ? ?Encounter Date: 08/26/2021 ? ? PT End of Session - 08/26/21 1615   ? ? Visit Number 3   ? Date for PT Re-Evaluation 09/23/21   ? Authorization Type BCBS - VL: 100 (PT/OT/ST)   ? PT Start Time 1615   ? PT Stop Time 3888   ? PT Time Calculation (min) 43 min   ? Activity Tolerance Patient tolerated treatment well   ? Behavior During Therapy [facility] for tasks assessed/performed   ? ?  ?  ? ?  ? ? ?Past Medical History:  ?Diagnosis Date  ? Crohn's disease ([place])   ? Crohn's disease of intestine ([place]) 02/24/2005  ? Dizzy 09/14/2019  ? Postconcussion syndrome spring 2021  ? Post concussion syndrome 07/12/2019  ? Tinnitus 09/14/2019  ? Following concussion 2021  ? ? ?History reviewed. No pertinent surgical history. ? ?There were no vitals filed for this visit. ? ? Subjective Assessment - 08/26/21 1616   ? ? Subjective Pt reports increased pain over the weekend up to 4-5/10 over following the introduction of the strengthening exercises but this seems to be resolving.   ? Patient Stated Goals "get back to playing paintball and normal activity"   ? Currently in Pain? Yes   ? Pain Score 1    ? Pain Location Groin   ? Pain Orientation Right;Left   ? ?  ?  ? ?  ? ? ? ? ? ? ? ? ? ? ? ? ? ? ? ? ? ? ? ? [facility] Adult PT Treatment/Exercise - 08/26/21 1615   ? ?  ? Knee/Hip Exercises: Stretches  ? Hip Flexor Stretch Right;Left;2 reps;30 seconds   each  ? Hip Flexor Stretch Limitations seated lunge position over edge of chair +/- lateral lean; standing with leg extended back on elevated plinth   ?  ? Knee/Hip Exercises: Aerobic  ? Recumbent Bike L3 x 6 min   ?  ? Knee/Hip Exercises: Standing  ? Forward Lunges Right;Left;2 sets;10  reps;3 seconds   ? Forward Lunges Limitations TRX   ? Side Lunges Right;Left;2 sets;10 reps;3 seconds   ? Side Lunges Limitations TRX   ?  ? Manual Therapy  ? Manual Therapy Soft tissue mobilization;Myofascial release   ? Soft tissue mobilization STM/DTM to B iliacus in sidelying, B iliopsoas and proximal adductors and R proximal quads in supine/slight hooklying   ? Myofascial Release manual TPR to B iliacus & proximal adductors   ? ?  ?  ? ?  ? ? ? ? ? ? ? ? ? ? ? ? PT Short Term Goals - 08/26/21 1620   ? ?  ? PT SHORT TERM GOAL #1  ? Title Patient will be independent with initial HEP   ? Status Achieved   08/26/21  ?  ? PT SHORT TERM GOAL #2  ? Title Improve understanding of body mechanics with patient to verbalize and demo correct lifting techniques   ? Status On-going   ? Target Date 09/02/21   ? ?  ?  ? ?  ? ? ? ? PT Long Term Goals - 08/22/21 0806   ? ?  ? PT LONG TERM GOAL #1  ? Title Patient  will be independent with ongoing/advanced HEP for self-management at home   ? Status On-going   ? Target Date 09/23/21   ?  ? PT LONG TERM GOAL #2  ? Title Patient to improve tissue quality in B hip flexors and adductors as noted by reduced tissue tightness and tenderness to palpation   ? Status On-going   ? Target Date 09/23/21   ?  ? PT LONG TERM GOAL #3  ? Title Patient will demonstrate improved B proximal strength to >/= 4+ to 5/5 for improved stability and ease of mobility   ? Status On-going   ? Target Date 09/23/21   ?  ? PT LONG TERM GOAL #4  ? Title Patient to report ability to perform work and daily/recreational activities including playing paintball without pain provocation   ? Status On-going   ? Target Date 09/23/21   ? ?  ?  ? ?  ? ? ? ? ? ? ? ? Plan - 08/26/21 1711   ? ? Clinical Impression Statement Phillip Mcclain reports HEP going well but did note some increased soreness initially following introduction of the strengthening exercises but states this has been slowly resolving with minimal pain today. He did request  review/clarification of the hip flexor stretch - reviewed modified version from last visit as well as additional modifications and alternative stretch positions. Pt reports good understanding of all HEP exercises following review, denying need for any further review of remaining stretches or strengthening exercises - STG #1 met. He reports attempting some self-release for hip flexors at home both manually and with massage gun and feels like he was able to get some relief. Mild residual tightness noted in B iliacus, distal iliopsoas and proximal hip adductors which was addressed with manual STM/DTM and TPR with further reduction in muscle tension achieved. Progressed strengthening to include fwd and lateral lunges with pt noting mild muscle burn by end of set.   ? Comorbidities Crohn's disease, h/o concussion   ? Rehab Potential Good   ? PT Frequency 2x / week   ? PT Duration 6 weeks   ? PT Treatment/Interventions ADLs/Self Care Home Management;Cryotherapy;Electrical Stimulation;Moist Heat;Ultrasound;Functional mobility training;Therapeutic activities;Therapeutic exercise;Neuromuscular re-education;Patient/family education;Manual techniques;Passive range of motion;Dry needling;Taping   ? PT Next Visit Plan lifting & body mechanics; progress proximal LE flexibilty and lumbopelvic strengthening - HEP updates as indicated; MT +/- DN to B hip adductors and iliacus/iliopsoas as indicated   ? PT Home Exercise Plan Access Code: [email or web address]   ? Consulted and Agree with Plan of Care Patient   ? ?  ?  ? ?  ? ? ?Patient will benefit from skilled therapeutic intervention in order to improve the following deficits and impairments:  Decreased activity tolerance, Decreased strength, Increased fascial restricitons, Increased muscle spasms, Impaired flexibility, Impaired perceived functional ability, Pain ? ?Visit Diagnosis: ?Pain in right hip ? ?Pain in left hip ? ?Other symptoms and signs involving the musculoskeletal system ? ?Other  muscle spasm ? ? ? ? ?Problem List ?Patient Active Problem List  ? Diagnosis Date Noted  ? Closed fracture of fifth toe of left foot 07/01/2021  ? Hallux rigidus of left foot 07/01/2021  ? Crohn's disease of intestine ([place]) 02/24/2005  ? ? ?Percival Spanish, PT ?08/26/2021, 5:20 PM ? ?[facility] ?[place] ?[place], [place], [address] ?Phone: [phone]   Fax:  [phone] ? ?Name: Phillip Mcclain ?MRN: [id number] ?Date of Birth: 1990-07-16 ? ? ? ?

## 2021-08-28 ENCOUNTER — Ambulatory Visit: Payer: BC Managed Care – PPO

## 2021-08-28 DIAGNOSIS — R279 Unspecified lack of coordination: Secondary | ICD-10-CM | POA: Diagnosis not present

## 2021-08-28 DIAGNOSIS — M6281 Muscle weakness (generalized): Secondary | ICD-10-CM | POA: Diagnosis not present

## 2021-08-28 DIAGNOSIS — R29898 Other symptoms and signs involving the musculoskeletal system: Secondary | ICD-10-CM | POA: Diagnosis not present

## 2021-08-28 DIAGNOSIS — M25552 Pain in left hip: Secondary | ICD-10-CM

## 2021-08-28 DIAGNOSIS — M62838 Other muscle spasm: Secondary | ICD-10-CM

## 2021-08-28 DIAGNOSIS — M25551 Pain in right hip: Secondary | ICD-10-CM

## 2021-08-28 DIAGNOSIS — R293 Abnormal posture: Secondary | ICD-10-CM | POA: Diagnosis not present

## 2021-08-28 NOTE — Therapy (Addendum)
[facility] [address]  [place], [place], [address] Phone: [phone]   Fax:  [phone]  Physical Therapy Treatment / Discharge Summary  Patient Details  Name: Phillip Mcclain MRN: [id number] Date of Birth: 19-Feb-1991 Referring Provider (PT): Riki Sheer, MD   Encounter Date: 08/28/2021   PT End of Session - 08/28/21 1706     Visit Number 4    Date for PT Re-Evaluation 09/23/21    Authorization Type BCBS - VL: 100 (PT/OT/ST)    PT Start Time 2080    PT Stop Time 1700    PT Time Calculation (min) 43 min    Activity Tolerance Patient tolerated treatment well    Behavior During Therapy [facility] for tasks assessed/performed             Past Medical History:  Diagnosis Date   Crohn's disease ([place])    Crohn's disease of intestine ([place]) 02/24/2005   Dizzy 09/14/2019   Postconcussion syndrome spring 2021   Post concussion syndrome 07/12/2019   Tinnitus 09/14/2019   Following concussion 2021    History reviewed. No pertinent surgical history.  There were no vitals filed for this visit.   Subjective Assessment - 08/28/21 1620     Subjective Exercises I believe have helped, this weekend I am going to be doing a lot more lifting than I have done since I got injured then going to play paintball the day after.    Patient Stated Goals "get back to playing paintball and normal activity"    Currently in Pain? Yes    Pain Score 2     Pain Location Groin    Pain Orientation Right;Left    Pain Descriptors / Indicators Tightness    Pain Type Acute pain                               [facility] Adult PT Treatment/Exercise - 08/28/21 0001       Therapeutic Activites    Therapeutic Activities Work Economist;Lifting    Lifting edu given on proper lifting mechanics, easier ways to lift heavy items; lifting 30lb box x8      Knee/Hip Exercises: Stretches   Other Knee/Hip Stretches R/L butterfly stretch  2x30"  R/L hip flexor stretch in seated lunge position     Knee/Hip Exercises: Aerobic   Recumbent Bike L3 x 6 min      Manual Therapy   Manual Therapy Soft tissue mobilization;Myofascial release    Soft tissue mobilization STM to B hip flexors, adductors    Myofascial Release manual TPR to B iliacus & proximal adductors                     PT Education - 08/28/21 1756     Education Details posture and body mechanics handout provided; reviewed lifting techniques    Person(s) Educated Patient    Methods Explanation;Demonstration;Verbal cues;Handout    Comprehension Verbalized understanding;Returned demonstration;Verbal cues required              PT Short Term Goals - 08/26/21 1620       PT SHORT TERM GOAL #1   Title Patient will be independent with initial HEP    Status Achieved   08/26/21     PT SHORT TERM GOAL #2   Title Improve understanding of body mechanics with patient to verbalize and demo correct lifting techniques  Status On-going    Target Date 09/02/21               PT Long Term Goals - 08/22/21 0806       PT LONG TERM GOAL #1   Title Patient will be independent with ongoing/advanced HEP for self-management at home    Status On-going    Target Date 09/23/21      PT LONG TERM GOAL #2   Title Patient to improve tissue quality in B hip flexors and adductors as noted by reduced tissue tightness and tenderness to palpation    Status On-going    Target Date 09/23/21      PT LONG TERM GOAL #3   Title Patient will demonstrate improved B proximal strength to >/= 4+ to 5/5 for improved stability and ease of mobility    Status On-going    Target Date 09/23/21      PT LONG TERM GOAL #4   Title Patient to report ability to perform work and daily/recreational activities including playing paintball without pain provocation    Status On-going    Target Date 09/23/21                   Plan - 08/28/21 1706     Clinical Impression  Statement Pt noted some benefit from [facility] last session. Today demonstrated more tightness on the R side. Pt reported decreased pain after MT and increased flexibility. Followed MT with stretches to further increase flexibility, then reviewed posture and body mechanics. Provided instruction on easier lifting techniques with wide BOS and keeping close proximity to object being lifted. Pt was w/o any concerns after session.    Personal Factors and Comorbidities Comorbidity 2;Profession    Comorbidities Crohn's disease, h/o concussion    PT Frequency 2x / week    PT Duration 6 weeks    PT Treatment/Interventions ADLs/Self Care Home Management;Cryotherapy;Electrical Stimulation;Moist Heat;Ultrasound;Functional mobility training;Therapeutic activities;Therapeutic exercise;Neuromuscular re-education;Patient/family education;Manual techniques;Passive range of motion;Dry needling;Taping    PT Next Visit Plan progress proximal LE flexibilty and lumbopelvic strengthening - HEP updates as indicated; MT +/- DN to B hip adductors and iliacus/iliopsoas as indicated    PT Home Exercise Plan Access Code: [email or web address]    Consulted and Agree with Plan of Care Patient             Patient will benefit from skilled therapeutic intervention in order to improve the following deficits and impairments:  Decreased activity tolerance, Decreased strength, Increased fascial restricitons, Increased muscle spasms, Impaired flexibility, Impaired perceived functional ability, Pain  Visit Diagnosis: Pain in right hip  Pain in left hip  Other symptoms and signs involving the musculoskeletal system  Other muscle spasm     Problem List Patient Active Problem List   Diagnosis Date Noted   Closed fracture of fifth toe of left foot 07/01/2021   Hallux rigidus of left foot 07/01/2021   Crohn's disease of intestine ([place]) 02/24/2005    [place], PTA 08/28/2021, 6:00 PM  [facility] [address]  [place], [place], [address] Phone: [phone]   Fax:  [phone]  Name: Phillip Mcclain MRN: [id number] Date of Birth: [birth year]   PHYSICAL THERAPY DISCHARGE SUMMARY  Visits from Start of Care: 4  Current functional level related to goals / functional outcomes:   Refer to above clinical impression for status as of last visit on 08/28/2021. Patient cancelled remaining appointments at this clinic due to MD  wanting him to transition to pelvic floor rehab at another [facility] location, therefore will proceed with discharge from PT for this episode.   Remaining deficits:   As above. Pt will continue with pelvic floor rehab.    Education / Equipment:   HEP   Patient agrees to discharge. Patient goals were partially met. Patient is being discharged due to  transitioning to pelvic floor rehab.  Percival Spanish, PT, MPT 10/28/21, 2:21 PM  [facility] [address]  [place], [place], [address] Phone: [phone]   Fax:  [phone]

## 2021-08-29 NOTE — Progress Notes (Signed)
? ?  I, Phillip Mcclain, LAT, ATC, am serving as scribe for Dr. [name]. ? ?Phillip Mc[place] is a 31 y.o. male who presents to [place] [facility] today for f/u of lower abdominal and B groin pain after getting hit in the scrotum while playing paintball on 07/20/21.  He was last seen by Dr. Georgina Snell on 08/07/21 and was advised to proceed w/ PT prescribed by his PCP.  He has now completed 4 PT sessions.  Today, pt reports feeling about the same. Pt notes improvement in the hip flexor strength. Pt continues to have pain in his scrotum, along the point of injury, and into the perineum. ? ? ?Pertinent review of systems: No fevers or chills ? ?Relevant historical information: Crohn's disease ? ? ?Exam:  ?BP 122/80   Pulse 75   Ht 5' 5"  (1.651 m)   Wt 114 lb 6.4 oz (51.9 kg)   SpO2 100%   BMI 19.04 kg/m?  ?General: Well Developed, well nourished, and in no acute distress.  ? ?MSK: Normal hip motion ? ? ? ? ? ?Assessment and Plan: ?31 y.o. male with perineum injury from playing paint ball.  He does not have an actual injury to the testicles but of the perineum. ?He has had some improvement with conventional physical therapy but still is having discomfort in his deep pelvis region.  I think the perineum is bruised and the geniculate nerve branches are irritated.  This point will refer to pelvic physical therapy and to urology.  If urology thinks he would benefit from pelvic ultrasound I am happy to order it or they can order it. ?Would appreciate feedback urology recommendation. ? ? ? ?PDMP not reviewed this encounter. ?Orders Placed This Encounter  ?Procedures  ? Ambulatory referral to Urology  ?  Referral Priority:   Routine  ?  Referral Type:   Consultation  ?  Referral Reason:   Specialty Services Required  ?  Requested Specialty:   Urology  ?  Number of Visits Requested:   1  ? Ambulatory referral to Physical Therapy  ?  Referral Priority:   Routine  ?  Referral Type:   Physical Medicine  ?  Referral  Reason:   Specialty Services Required  ?  Requested Specialty:   Physical Therapy  ?  Number of Visits Requested:   1  ? ?No orders of the defined types were placed in this encounter. ? ? ? ?Discussed warning signs or symptoms. Please see discharge instructions. Patient expresses understanding. ? ? ?The above documentation has been reviewed and is accurate and complete [name], M.D. ? ? ?

## 2021-09-01 ENCOUNTER — Ambulatory Visit (INDEPENDENT_AMBULATORY_CARE_PROVIDER_SITE_OTHER): Payer: BC Managed Care – PPO | Admitting: Family Medicine

## 2021-09-01 VITALS — BP 122/80 | HR 75 | Ht 65.0 in | Wt 114.4 lb

## 2021-09-01 DIAGNOSIS — R1031 Right lower quadrant pain: Secondary | ICD-10-CM | POA: Diagnosis not present

## 2021-09-01 DIAGNOSIS — R1032 Left lower quadrant pain: Secondary | ICD-10-CM | POA: Diagnosis not present

## 2021-09-01 NOTE — Patient Instructions (Addendum)
Thank you for coming in today.  ? ?I've referred you to Pelvic Physical Therapy.  Let us know if you don't hear from them in one week.  ? ?I've referred you to [facility].  Let us know if you don't hear from them in one week.  ? ?Recheck back as needed ?

## 2021-09-02 ENCOUNTER — Encounter: Payer: BC Managed Care – PPO | Admitting: Physical Therapy

## 2021-09-08 ENCOUNTER — Encounter: Payer: BC Managed Care – PPO | Admitting: Physical Therapy

## 2021-09-09 ENCOUNTER — Encounter: Payer: Self-pay | Admitting: Family Medicine

## 2021-09-10 ENCOUNTER — Ambulatory Visit: Payer: BC Managed Care – PPO | Admitting: Physical Therapy

## 2021-09-10 ENCOUNTER — Encounter: Payer: Self-pay | Admitting: Physical Therapy

## 2021-09-10 DIAGNOSIS — M62838 Other muscle spasm: Secondary | ICD-10-CM | POA: Diagnosis not present

## 2021-09-10 DIAGNOSIS — R293 Abnormal posture: Secondary | ICD-10-CM

## 2021-09-10 DIAGNOSIS — M6281 Muscle weakness (generalized): Secondary | ICD-10-CM | POA: Diagnosis not present

## 2021-09-10 DIAGNOSIS — M25552 Pain in left hip: Secondary | ICD-10-CM | POA: Diagnosis not present

## 2021-09-10 DIAGNOSIS — R29898 Other symptoms and signs involving the musculoskeletal system: Secondary | ICD-10-CM | POA: Diagnosis not present

## 2021-09-10 DIAGNOSIS — M25551 Pain in right hip: Secondary | ICD-10-CM | POA: Diagnosis not present

## 2021-09-10 DIAGNOSIS — R279 Unspecified lack of coordination: Secondary | ICD-10-CM

## 2021-09-10 NOTE — Therapy (Signed)
?OUTPATIENT PHYSICAL THERAPY MALE PELVIC EVALUATION ? ? ?Patient Name: Phillip Mcclain ?MRN: [id number] ?DOB:September 02, 1990, 31 y.o., male ?Today's Date: 09/10/2021 ? ? PT End of Session - 09/10/21 1660   ? ? Visit Number 1   ? Date for PT Re-Evaluation 12/10/21   ? Authorization Type BCBS   ? PT Start Time 0800   ? PT Stop Time [phone]   ? PT Time Calculation (min) 43 min   ? Activity Tolerance Patient tolerated treatment well   ? Behavior During Therapy [facility] for tasks assessed/performed   ? ?  ?  ? ?  ? ? ?Past Medical History:  ?Diagnosis Date  ? Crohn's disease ([place])   ? Crohn's disease of intestine ([place]) 02/24/2005  ? Dizzy 09/14/2019  ? Postconcussion syndrome spring 2021  ? Post concussion syndrome 07/12/2019  ? Tinnitus 09/14/2019  ? Following concussion 2021  ? ?History reviewed. No pertinent surgical history. ?Patient Active Problem List  ? Diagnosis Date Noted  ? Closed fracture of fifth toe of left foot 07/01/2021  ? Hallux rigidus of left foot 07/01/2021  ? Crohn's disease of intestine ([place]) 02/24/2005  ? ? ?PCP: Shelda Pal, DO ? ?REFERRING PROVIDER: Shelda Pal* ? ?REFERRING DIAG: R10.31,R10.32 (ICD-10-CM) - Bilateral groin pain ? ?THERAPY DIAG:  ?Other muscle spasm ? ?Abnormal posture ? ?Muscle weakness (generalized) ? ?Unspecified lack of coordination ? ?ONSET DATE: 07/20/21 ? ?SUBJECTIVE:                                                                                                                                                                                          ? ?SUBJECTIVE STATEMENT: ?Pt plays competitive  paintball, was hit in perineum.  Reports one week later had a painful orgasm and had intense Lt adductor/hip flexor tightness. Doctor ruled out hernia, went to PT for hip flexors however this did not fully relieve pain but did take away hip pain and calmed down the perineum pain somewhat but not gone. Pt can have non painful orgasms and erections are not painful but after orgasm,  tightness/pain is worse for at least a few hours. Does have constant soreness at Lt perineum. Rt testicle has been hypersensitive compared to Lt, no blood, no swelling. Pt does reports some radicular pain upward "under ribs or outside of abs".  ? ?Patient confirms identification and approves PT to assess pelvic floor and treatment Yes ? ? ?PAIN:  ?Are you having pain? Yes ?NPRS scale: 5/10 ?Pain location: Internal, External, Right, Left, and perineum  ? ?Pain type: aching ?Pain description: intermittent and constant (sometimes worse but always something ? ?Aggravating factors: orgasm, sudden touch ?  Relieving factors: Rest ? ?PRECAUTIONS: None ? ?WEIGHT BEARING RESTRICTIONS No ? ?FALLS:  ?Has patient fallen in last 6 months? No ? ?LIVING ENVIRONMENT: ?Lives with: lives with their spouse ?Lives in: House/apartment ? ? 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 ?PLOF: Independent ? ?PATIENT GOALS to have no pain ? ?PERTINENT HISTORY:  ?Chron's  ?Sexual abuse:  NO ? ?BOWEL MOVEMENT ?Pain with bowel movement: No ?Type of bowel movement:Type (Bristol Stool Scale) varies, Frequency regular for him, and Strain No ?Fully empty rectum: Yes:   ?Leakage: No ?Pads: No ?Fiber supplement: No ? ?URINATION ?Pain with urination: No ?Fully empty bladder: Yes:   ?Stream: Strong ?Urgency: No ?Frequency: no ?Leakage:  NO ?Pads: No ? ?INTERCOURSE ?Pain with intercourse: During [place] ?Cl[place]ax: Does have pain after orgasm ?Ejaculation: Yes: does have pain ? ? ? ? ?OBJECTIVE:  ? ?DIAGNOSTIC FINDINGS:  ? ? ?COGNITION: ? Overall cognitive status: Within functional limits for tasks assessed   ?  ?SENSATION: ? Light touch: Appears intact ? Proprioception: Appears intact ? ?MUSCLE LENGTH: ?Bil adductors limited by 50% ?Bil Hamstrings limited by 25% ? ? ? ?POSTURE:  ?Mild posterior pelvic tilt ? ?LUMBARAROM/PROM ? ?WFL ? ?LE AROM/PROM: ? ?WFL ? ?LE MMT: ? ?Bil hip abduction causes pain at groin and 4/5; all  others 4+/5 ? ?PELVIC MMT: deferred ?  ?MMT  ?09/10/2021  ?Internal Anal Sphincter   ?External Anal Sphincter   ?Puborectalis   ?Diastasis Recti   ?(Blank rows = not tested) ? ?PALPATION: ?GENERAL moderate fascial tightness felt in all abdominal quadrants ? ?            External Perineal Exam WFL, no redness. However did have TTP at bil proximal adductors Lt>Rt with and mild TTP at perineal body, bil medial glute max, levator ani, and pubococcygeus ? ?            Internal Pelvic Floor deferred at pt request ? ?TONE: ?deferred ? ?TODAY'S TREATMENT  ? ?09/10/2021 EVAL Examination completed, findings reviewed, pt educated on POC, HEP, and pelvic relaxation techniques. Pt motivated to participate in PT and agreeable to attempt recommendations. Manual work completed for UG diaphragm release and at bil proximal adductors to attempt to release tightness and decrease pain. Pt reported not feeling as tight but didn't think mild pain was relieved at time of eval/treatment.  ? ? ? ?PATIENT EDUCATION:  ?Education details: [id number] ?Person educated: Patient ?Education method: Explanation, Demonstration, Tactile cues, Verbal cues, and Handouts ?Education comprehension: verbalized understanding and returned demonstration ? ? ?HOME EXERCISE PROGRAM: ?[id number] ? ?ASSESSMENT: ? ?CLINICAL IMPRESSION: ?Patient is a 31 y.o. male  who was seen today for physical therapy evaluation and treatment for bil groin pain status post being shot with a paintball at perineum.  Pt limited with pain with sudden touch at testes and during orgasm and post orgasm/ejaculation with tightness worsening and pain more prominent with aching. Pt found to have decreased flexibility at bil hips, decreased strength at bil hips, tightness noted in all quadrants of abdomen, TTP externally at bil proximal adductors Lt>Rt with and mild TTP at perineal body, bil medial glute max, levator ani, and pubococcygeus. Pt educated on HEP and pelvic relaxation techniques for  home and is motivated to attempt PT to decrease pain. Pt would benefit from additional PT to further address deficits.   ? ? ?OBJECTIVE IMPAIRMENTS decreased coordination, decreased endurance, decreased strength, increased fascial restrictions, increased muscle spasms, impaired flexibility, improper body  mechanics, postural dysfunction, and pain.  ? ?ACTIVITY LIMITATIONS community activity and intercourse .  ? ?PERSONAL FACTORS 1 comorbidity: trauma to perineum    are also affecting patient's functional outcome.  ? ? ?REHAB POTENTIAL: Good ? ?CLINICAL DECISION MAKING: Stable/uncomplicated ? ?EVALUATION COMPLEXITY: Low ? ? ?GOALS: ?Goals reviewed with patient? Yes ? ?SHORT TERM GOALS: Target date: 10/08/2021 ? ?Pt to be I with HEP. ?Baseline: ?Goal status: INITIAL ? ?2.  Pt will report  reduction of pain to no more than 2/10 due to improvements in posture, strength, and muscle length  ?Baseline:  ?Goal status: INITIAL ? ? ? ?LONG TERM GOALS: Target date: 12/10/2021 ? ?Pt to be I with advanced HEP. ?Baseline:  ?Goal status: INITIAL ? ?2.  Pt will report  reduction of pain to no more than 1/10 due to improvements in posture, strength, and muscle length  ?Baseline:  ?Goal status: INITIAL ? ?3.  Pt to demonstrate good ability with minimal cues to contract/relax/bulge pelvic floor for improved ability to relax pelvic floor and decrease pain. ?Baseline:  ?Goal status: INITIAL ? ?4.  Pt to demonstrate no fascial restrictions in abdomen with implementation of relaxation techniques and stretching.  ?Baseline:  ?Goal status: INITIAL ? ? ? ? ?PLAN: ?PT FREQUENCY: 1x/week ? ?PT DURATION:  8 sessions ? ?PLANNED INTERVENTIONS: Therapeutic exercises, Therapeutic activity, Neuromuscular re-education, Patient/Family education, Joint mobilization, Dry Needling, Spinal mobilization, Cryotherapy, Moist heat, Manual lymph drainage, scar mobilization, Taping, Biofeedback, and Manual therapy ? ?PLAN FOR NEXT SESSION: manual at abdomen,  relaxation techniques, manual work at perineum , vs. Internal rectal if needed and pt agreeable.  ? ?Stacy Gardner, PT, DPT ?04/26/239:45 AM  ? ? ?

## 2021-09-16 ENCOUNTER — Encounter: Payer: BC Managed Care – PPO | Admitting: Physical Therapy

## 2021-09-16 ENCOUNTER — Ambulatory Visit: Payer: BC Managed Care – PPO | Attending: Family Medicine | Admitting: Physical Therapy

## 2021-09-16 DIAGNOSIS — M62838 Other muscle spasm: Secondary | ICD-10-CM | POA: Diagnosis not present

## 2021-09-16 DIAGNOSIS — M6281 Muscle weakness (generalized): Secondary | ICD-10-CM | POA: Diagnosis not present

## 2021-09-16 DIAGNOSIS — R279 Unspecified lack of coordination: Secondary | ICD-10-CM | POA: Diagnosis not present

## 2021-09-16 DIAGNOSIS — R293 Abnormal posture: Secondary | ICD-10-CM | POA: Insufficient documentation

## 2021-09-16 NOTE — Therapy (Signed)
?OUTPATIENT PHYSICAL THERAPY TREATMENT NOTE ? ? ?Patient Name: Phillip Mcclain ?MRN: [id number] ?DOB:1990/10/25, 31 y.o., male ?Today's Date: 09/16/2021 ? ?PCP: Shelda Pal, DO ?REFERRING PROVIDER: Shelda Pal* ? ?END OF SESSION:  ? PT End of Session - 09/16/21 6712   ? ? Visit Number 2   ? Date for PT Re-Evaluation 12/10/21   ? Authorization Type BCBS   ? PT Start Time 0845   ? PT Stop Time 0925   ? PT Time Calculation (min) 40 min   ? Activity Tolerance Patient tolerated treatment well   ? Behavior During Therapy [facility] for tasks assessed/performed   ? ?  ?  ? ?  ? ? ?Past Medical History:  ?Diagnosis Date  ? Crohn's disease ([place])   ? Crohn's disease of intestine ([place]) 02/24/2005  ? Dizzy 09/14/2019  ? Postconcussion syndrome spring 2021  ? Post concussion syndrome 07/12/2019  ? Tinnitus 09/14/2019  ? Following concussion 2021  ? ?No past surgical history on file. ?Patient Active Problem List  ? Diagnosis Date Noted  ? Closed fracture of fifth toe of left foot 07/01/2021  ? Hallux rigidus of left foot 07/01/2021  ? Crohn's disease of intestine ([place]) 02/24/2005  ? ? ?REFERRING DIAG: R10.31,R10.32 (ICD-10-CM) - Bilateral groin pain ? ?THERAPY DIAG:  ?Muscle weakness (generalized) ? ?Other muscle spasm ? ?Unspecified lack of coordination ? ?PERTINENT HISTORY: Chron's  ?Sexual abuse:  NO ? ?PRECAUTIONS: none ? ?SUBJECTIVE: Pt reports he had a few days with little or no pain and some days had some tightness and pain returned. Pt reports he did do a paintball tournament since eval and this caused some pain and tightness afterward and had 2 orgasms one painful one not.  ? ?PAIN:  ?Are you having pain? Yes: NPRS scale: 1/10 ?Pain location: Rt hip flexor/adductor tightness ?Pain description: tightness ? ? ? ?OBJECTIVE: (objective measures completed at initial evaluation unless otherwise dated) ? ? ?OBJECTIVE:  ?  ?DIAGNOSTIC FINDINGS:  ?  ?  ?COGNITION: ?           Overall cognitive status: Within functional  limits for tasks assessed              ?            ?SENSATION: ?           Light touch: Appears intact ?           Proprioception: Appears intact ?  ?MUSCLE LENGTH: ?Bil adductors limited by 50% ?Bil Hamstrings limited by 25% ? ?  ?POSTURE:  ?Mild posterior pelvic tilt ?  ?LUMBARAROM/PROM ?  ?WFL ?  ?LE AROM/PROM: ?  ?WFL ?  ?LE MMT: ?  ?Bil hip abduction causes pain at groin and 4/5; all others 4+/5 ?  ?PELVIC MMT: deferred ?  ?MMT   ?09/10/2021  ?Internal Anal Sphincter    ?External Anal Sphincter    ?Puborectalis    ?Diastasis Recti    ?(Blank rows = not tested) ?  ?PALPATION: ?GENERAL moderate fascial tightness felt in all abdominal quadrants ?  ?            External Perineal Exam WFL, no redness. However did have TTP at bil proximal adductors Lt>Rt with and mild TTP at perineal body, bil medial glute max, levator ani, and pubococcygeus ?  ?            Internal Pelvic Floor deferred at pt request ?  ?TONE: ?deferred ?  ?TODAY'S TREATMENT  ?  ?  09/16/2021:  ?Manual work at [place] lower abdominal quadrant, Rt hip flexor, Rt proximal adductor, ischiocavernosus, superficial transverse and fascial release throughout Rt lower abdominal quadrant and Rt anterior pelvis.  ?X10 pelvic tilts on foam roller in sitting straddling foam roller - ant/post and Rt/Lt. ? ?09/10/2021 EVAL Examination completed, findings reviewed, pt educated on POC, HEP, and pelvic relaxation techniques. Pt motivated to participate in PT and agreeable to attempt recommendations. Manual work completed for UG diaphragm release and at bil proximal adductors to attempt to release tightness and decrease pain. Pt reported not feeling as tight but didn't think mild pain was relieved at time of eval/treatment.  ?  ?  ?  ?PATIENT EDUCATION:  ?Education details: [id number] ?Person educated: Patient ?Education method: Explanation, Demonstration, Tactile cues, Verbal cues, and Handouts ?Education comprehension: verbalized understanding and returned demonstration ?  ?   ?HOME EXERCISE PROGRAM: ?[id number] ?  ?ASSESSMENT: ?  ?CLINICAL IMPRESSION: ?Patient returns to clinic reporting some improvement with pain overall and reports he did have a pain free orgasm following several days low to no pain, but did have a painful orgasm while feeling more tight/painful after his paintball tournament. Pt reports he has not attempted foam rolling at home yet as he has been traveling a lot but has been doing stretching intermittently. Pt session focused on manual work and stretching with foam roller at end of session. Pt responded well with less tightness felt at all manual areas and less pain with palpation at end of manual work, after foam rolling pelvic tilts pt reports he "definitely felt a difference". Pt would benefit from additional PT to further address deficits.   ?  ?  ?OBJECTIVE IMPAIRMENTS decreased coordination, decreased endurance, decreased strength, increased fascial restrictions, increased muscle spasms, impaired flexibility, improper body mechanics, postural dysfunction, and pain.  ?  ?ACTIVITY LIMITATIONS community activity and intercourse .  ?  ?PERSONAL FACTORS 1 comorbidity: trauma to perineum    are also affecting patient's functional outcome.  ?  ?  ?REHAB POTENTIAL: Good ?  ?CLINICAL DECISION MAKING: Stable/uncomplicated ?  ?EVALUATION COMPLEXITY: Low ?  ?  ?GOALS: ?Goals reviewed with patient? Yes ?  ?SHORT TERM GOALS: Target date: 10/08/2021 ?  ?Pt to be I with HEP. ?Baseline: ?Goal status: INITIAL ?  ?2.  Pt will report  reduction of pain to no more than 2/10 due to improvements in posture, strength, and muscle length  ?Baseline:  ?Goal status: INITIAL ?  ?  ?  ?LONG TERM GOALS: Target date: 12/10/2021 ?  ?Pt to be I with advanced HEP. ?Baseline:  ?Goal status: INITIAL ?  ?2.  Pt will report  reduction of pain to no more than 1/10 due to improvements in posture, strength, and muscle length  ?Baseline:  ?Goal status: INITIAL ?  ?3.  Pt to demonstrate good ability with  minimal cues to contract/relax/bulge pelvic floor for improved ability to relax pelvic floor and decrease pain. ?Baseline:  ?Goal status: INITIAL ?  ?4.  Pt to demonstrate no fascial restrictions in abdomen with implementation of relaxation techniques and stretching.  ?Baseline:  ?Goal status: INITIAL ?  ?  ?  ?  ?PLAN: ?PT FREQUENCY: 1x/week ?  ?PT DURATION:  8 sessions ?  ?PLANNED INTERVENTIONS: Therapeutic exercises, Therapeutic activity, Neuromuscular re-education, Patient/Family education, Joint mobilization, Dry Needling, Spinal mobilization, Cryotherapy, Moist heat, Manual lymph drainage, scar mobilization, Taping, Biofeedback, and Manual therapy ?  ?PLAN FOR NEXT SESSION: manual at abdomen, relaxation techniques, manual work at perineum , vs.  Internal rectal if needed and pt agreeable.  ? ? ?Stacy Gardner, PT, DPT ?05/02/239:40 AM  ? ?  ? ?

## 2021-09-24 DIAGNOSIS — R102 Pelvic and perineal pain: Secondary | ICD-10-CM | POA: Diagnosis not present

## 2021-09-24 DIAGNOSIS — N50811 Right testicular pain: Secondary | ICD-10-CM | POA: Diagnosis not present

## 2021-09-25 ENCOUNTER — Ambulatory Visit: Payer: BC Managed Care – PPO | Admitting: Physical Therapy

## 2021-09-25 DIAGNOSIS — M62838 Other muscle spasm: Secondary | ICD-10-CM

## 2021-09-25 DIAGNOSIS — M6281 Muscle weakness (generalized): Secondary | ICD-10-CM | POA: Diagnosis not present

## 2021-09-25 DIAGNOSIS — R293 Abnormal posture: Secondary | ICD-10-CM | POA: Diagnosis not present

## 2021-09-25 DIAGNOSIS — R279 Unspecified lack of coordination: Secondary | ICD-10-CM | POA: Diagnosis not present

## 2021-09-25 NOTE — Therapy (Signed)
?OUTPATIENT PHYSICAL THERAPY TREATMENT NOTE ? ? ?Patient Name: Phillip Mcclain ?MRN: [id number] ?DOB:July 12, 1990, 31 y.o., male ?Today's Date: 09/25/2021 ? ?PCP: Shelda Pal, DO ?REFERRING PROVIDER: Shelda Pal* ? ?END OF SESSION:  ? PT End of Session - 09/25/21 0934   ? ? Visit Number 3   ? Date for PT Re-Evaluation 12/10/21   ? Authorization Type BCBS   ? PT Start Time [phone]   ? PT Stop Time 6378   ? PT Time Calculation (min) 41 min   ? Activity Tolerance Patient tolerated treatment well   ? Behavior During Therapy [facility] for tasks assessed/performed   ? ?  ?  ? ?  ? ? ? ?Past Medical History:  ?Diagnosis Date  ? Crohn's disease ([place])   ? Crohn's disease of intestine ([place]) 02/24/2005  ? Dizzy 09/14/2019  ? Postconcussion syndrome spring 2021  ? Post concussion syndrome 07/12/2019  ? Tinnitus 09/14/2019  ? Following concussion 2021  ? ?No past surgical history on file. ?Patient Active Problem List  ? Diagnosis Date Noted  ? Closed fracture of fifth toe of left foot 07/01/2021  ? Hallux rigidus of left foot 07/01/2021  ? Crohn's disease of intestine ([place]) 02/24/2005  ? ? ?REFERRING DIAG: R10.31,R10.32 (ICD-10-CM) - Bilateral groin pain ? ?THERAPY DIAG:  ?Other muscle spasm ? ?Unspecified lack of coordination ? ?Muscle weakness (generalized) ? ?PERTINENT HISTORY: Chron's  ?Sexual abuse:  NO ? ?PRECAUTIONS: none ? ?SUBJECTIVE: Pt reports he can now spent more times of the day not feeling consistently sore. Pt does have soreness in lower rt abdominals in the morning but does do foam rolling exercises in the evenings prior.  ? ?PAIN:  ?Are you having pain? Yes: NPRS scale: 1/10 ?Pain location: Rt hip flexor/adductor tightness ?Pain description: tightness ? ? ? ?OBJECTIVE: (objective measures completed at initial evaluation unless otherwise dated) ? ? ?OBJECTIVE:  ?  ?DIAGNOSTIC FINDINGS:  ?  ?  ?COGNITION: ?           Overall cognitive status: Within functional limits for tasks assessed              ?             ?SENSATION: ?           Light touch: Appears intact ?           Proprioception: Appears intact ?  ?MUSCLE LENGTH: ?Bil adductors limited by 50% ?Bil Hamstrings limited by 25% ? ?  ?POSTURE:  ?Mild posterior pelvic tilt ?  ?LUMBARAROM/PROM ?  ?WFL ?  ?LE AROM/PROM: ?  ?WFL ?  ?LE MMT: ?  ?Bil hip abduction causes pain at groin and 4/5; all others 4+/5 ?  ?PELVIC MMT: deferred ?  ?MMT   ?09/10/2021  ?Internal Anal Sphincter    ?External Anal Sphincter    ?Puborectalis    ?Diastasis Recti    ?(Blank rows = not tested) ?  ?PALPATION: ?GENERAL moderate fascial tightness felt in all abdominal quadrants ?  ?            External Perineal Exam WFL, no redness. However did have TTP at bil proximal adductors Lt>Rt with and mild TTP at perineal body, bil medial glute max, levator ani, and pubococcygeus ?  ?            Internal Pelvic Floor deferred at pt request ?  ?TONE: ?deferred ?  ?TODAY'S TREATMENT  ? ?09/25/2021: ?2x30s Rt adductor, abductor, hamstring stretches with strap ?2x30s cobra  and 2x30s trunk rotation Rt  ?Manual work at [place] lower abdominal quadrant, Rt pectineus, Rt proximal/medial adductor, ischiocavernosus, bil superficial transverse. Fascial release Rt oblique, anterior and mid lower abdominal quadrants.  ? ?09/16/2021:  ?Manual work at [place] lower abdominal quadrant, Rt hip flexor, Rt proximal adductor, ischiocavernosus, superficial transverse and fascial release throughout Rt lower abdominal quadrant and Rt anterior pelvis.  ?X10 pelvic tilts on foam roller in sitting straddling foam roller - ant/post and Rt/Lt. ? ?09/10/2021 EVAL Examination completed, findings reviewed, pt educated on POC, HEP, and pelvic relaxation techniques. Pt motivated to participate in PT and agreeable to attempt recommendations. Manual work completed for UG diaphragm release and at bil proximal adductors to attempt to release tightness and decrease pain. Pt reported not feeling as tight but didn't think mild pain was relieved at time of  eval/treatment.  ?  ?  ?  ?PATIENT EDUCATION:  ?Education details: [id number] ?Person educated: Patient ?Education method: Explanation, Demonstration, Tactile cues, Verbal cues, and Handouts ?Education comprehension: verbalized understanding and returned demonstration ?  ?  ?HOME EXERCISE PROGRAM: ?[id number] ?  ?ASSESSMENT: ?  ?CLINICAL IMPRESSION: ?Patient returns to clinic reporting he has noticed great improvement so far with pain, now not having constant soreness. Pt reports he has been doing HEP daily and foam rolling and does have some soreness but not as bad. Pt session focused on stretching and manual work at [place] abdominal region and pelvic floor for improved tissue release and mobility and decreased pain. Pt tolerated well and reported he felt like manual today was very similar to his pain/soreness and feels this has been helpful. Pt would benefit from additional PT to further address deficits.   ?  ?  ?OBJECTIVE IMPAIRMENTS decreased coordination, decreased endurance, decreased strength, increased fascial restrictions, increased muscle spasms, impaired flexibility, improper body mechanics, postural dysfunction, and pain.  ?  ?ACTIVITY LIMITATIONS community activity and intercourse .  ?  ?PERSONAL FACTORS 1 comorbidity: trauma to perineum    are also affecting patient's functional outcome.  ?  ?  ?REHAB POTENTIAL: Good ?  ?CLINICAL DECISION MAKING: Stable/uncomplicated ?  ?EVALUATION COMPLEXITY: Low ?  ?  ?GOALS: ?Goals reviewed with patient? Yes ?  ?SHORT TERM GOALS: Target date: 10/08/2021 ?  ?Pt to be I with HEP. ?Baseline: ?Goal status: INITIAL ?  ?2.  Pt will report  reduction of pain to no more than 2/10 due to improvements in posture, strength, and muscle length  ?Baseline:  ?Goal status: INITIAL ?  ?  ?  ?LONG TERM GOALS: Target date: 12/10/2021 ?  ?Pt to be I with advanced HEP. ?Baseline:  ?Goal status: INITIAL ?  ?2.  Pt will report  reduction of pain to no more than 1/10 due to improvements in posture,  strength, and muscle length  ?Baseline:  ?Goal status: INITIAL ?  ?3.  Pt to demonstrate good ability with minimal cues to contract/relax/bulge pelvic floor for improved ability to relax pelvic floor and decrease pain. ?Baseline:  ?Goal status: INITIAL ?  ?4.  Pt to demonstrate no fascial restrictions in abdomen with implementation of relaxation techniques and stretching.  ?Baseline:  ?Goal status: INITIAL ?  ?  ?  ?  ?PLAN: ?PT FREQUENCY: 1x/week ?  ?PT DURATION:  8 sessions ?  ?PLANNED INTERVENTIONS: Therapeutic exercises, Therapeutic activity, Neuromuscular re-education, Patient/Family education, Joint mobilization, Dry Needling, Spinal mobilization, Cryotherapy, Moist heat, Manual lymph drainage, scar mobilization, Taping, Biofeedback, and Manual therapy ?  ?PLAN FOR NEXT SESSION: manual at abdomen, relaxation  techniques, manual work at perineum  ? ? ?Stacy Gardner, PT, DPT ?05/11/239:46 AM  ? ?  ? ?

## 2021-09-29 ENCOUNTER — Ambulatory Visit: Payer: BC Managed Care – PPO | Admitting: Physical Therapy

## 2021-09-29 DIAGNOSIS — M62838 Other muscle spasm: Secondary | ICD-10-CM

## 2021-09-29 DIAGNOSIS — R279 Unspecified lack of coordination: Secondary | ICD-10-CM | POA: Diagnosis not present

## 2021-09-29 DIAGNOSIS — M6281 Muscle weakness (generalized): Secondary | ICD-10-CM | POA: Diagnosis not present

## 2021-09-29 DIAGNOSIS — R293 Abnormal posture: Secondary | ICD-10-CM

## 2021-09-29 NOTE — Therapy (Signed)
?OUTPATIENT PHYSICAL THERAPY TREATMENT NOTE ? ? ?Patient Name: Phillip Mcclain ?MRN: [id number] ?DOB:1991/05/18, 31 y.o., male ?Today's Date: 09/29/2021 ? ?PCP: Shelda Pal, DO ?REFERRING PROVIDER: Shelda Pal* ? ?END OF SESSION:  ? PT End of Session - 09/29/21 0755   ? ? Visit Number 4   ? Date for PT Re-Evaluation 12/10/21   ? Authorization Type BCBS   ? PT Start Time 0800   ? PT Stop Time 0840   ? PT Time Calculation (min) 40 min   ? Activity Tolerance Patient tolerated treatment well   ? Behavior During Therapy [facility] for tasks assessed/performed   ? ?  ?  ? ?  ? ? ? ?Past Medical History:  ?Diagnosis Date  ? Crohn's disease ([place])   ? Crohn's disease of intestine ([place]) 02/24/2005  ? Dizzy 09/14/2019  ? Postconcussion syndrome spring 2021  ? Post concussion syndrome 07/12/2019  ? Tinnitus 09/14/2019  ? Following concussion 2021  ? ?No past surgical history on file. ?Patient Active Problem List  ? Diagnosis Date Noted  ? Closed fracture of fifth toe of left foot 07/01/2021  ? Hallux rigidus of left foot 07/01/2021  ? Crohn's disease of intestine ([place]) 02/24/2005  ? ? ?REFERRING DIAG: R10.31,R10.32 (ICD-10-CM) - Bilateral groin pain ? ?THERAPY DIAG:  ?Other muscle spasm ? ?Unspecified lack of coordination ? ?Abnormal posture ? ?PERTINENT HISTORY: Chron's  ?Sexual abuse:  NO ? ?PRECAUTIONS: none ? ?SUBJECTIVE: Pt reports he did drills for paintball yesterday and is pretty sore this morning. Pt reports he did a lot of  running with trunk rotation for drills for shooting. Pt states prior to this he was doing well with pain levels and pain is not constant any longer, doesn't notice it in standing or walking only sitting now.  ? ?PAIN:  ?Are you having pain? Yes: NPRS scale: 6/10 at abdominals, 4/10 Rt hip flexor and into pelvis ?Pain description: tightness ? ? ? ?OBJECTIVE: (objective measures completed at initial evaluation unless otherwise dated) ? ? ?OBJECTIVE:  ?  ?DIAGNOSTIC FINDINGS:  ?  ?   ?COGNITION: ?           Overall cognitive status: Within functional limits for tasks assessed              ?            ?SENSATION: ?           Light touch: Appears intact ?           Proprioception: Appears intact ?  ?MUSCLE LENGTH: ?Bil adductors limited by 50% ?Bil Hamstrings limited by 25% ? ?  ?POSTURE:  ?Mild posterior pelvic tilt ?  ?LUMBARAROM/PROM ?  ?WFL ?  ?LE AROM/PROM: ?  ?WFL ?  ?LE MMT: ?  ?Bil hip abduction causes pain at groin and 4/5; all others 4+/5 ?  ?PELVIC MMT: deferred ?  ?MMT   ?09/10/2021  ?Internal Anal Sphincter    ?External Anal Sphincter    ?Puborectalis    ?Diastasis Recti    ?(Blank rows = not tested) ?  ?PALPATION: ?GENERAL moderate fascial tightness felt in all abdominal quadrants ?  ?            External Perineal Exam WFL, no redness. However did have TTP at bil proximal adductors Lt>Rt with and mild TTP at perineal body, bil medial glute max, levator ani, and pubococcygeus ?  ?            Internal Pelvic Floor  deferred at pt request ?  ?TONE: ?deferred ?  ?TODAY'S TREATMENT  ? ? 09/29/2021: ?Foam roller: bil glutes, bil hip abductors, hip flexors into lower abdominals x10 ?Adductors rocks x10 ?Frog stretch 2x45s ?Thoracic openers with lower trunk rotation 3x30s each ?Trunk rotations x10 ?Manual work at lower abdominal quadrants with fascial release technique direct method completed with good effect. Manual soft tissue and fascial release completed at Rt adductor proximally and suction cup used here to improve tissue mobility, manual without suction cup at Rt superficial transverse. Pt had no tightness felt at Lt side and focus of treatment at Rt.  ? ?09/25/2021: ?2x30s Rt adductor, abductor, hamstring stretches with strap ?2x30s cobra and 2x30s trunk rotation Rt  ?Manual work at [place] lower abdominal quadrant, Rt pectineus, Rt proximal/medial adductor, ischiocavernosus, bil superficial transverse. Fascial release Rt oblique, anterior and mid lower abdominal quadrants.  ? ?09/16/2021:   ?Manual work at [place] lower abdominal quadrant, Rt hip flexor, Rt proximal adductor, ischiocavernosus, superficial transverse and fascial release throughout Rt lower abdominal quadrant and Rt anterior pelvis.  ?X10 pelvic tilts on foam roller in sitting straddling foam roller - ant/post and Rt/Lt. ? ?09/10/2021 EVAL Examination completed, findings reviewed, pt educated on POC, HEP, and pelvic relaxation techniques. Pt motivated to participate in PT and agreeable to attempt recommendations. Manual work completed for UG diaphragm release and at bil proximal adductors to attempt to release tightness and decrease pain. Pt reported not feeling as tight but didn't think mild pain was relieved at time of eval/treatment.  ?  ?  ?  ?PATIENT EDUCATION:  ?Education details: [id number] ?Person educated: Patient ?Education method: Explanation, Demonstration, Tactile cues, Verbal cues, and Handouts ?Education comprehension: verbalized understanding and returned demonstration ?  ?  ?HOME EXERCISE PROGRAM: ?[id number] ?  ?ASSESSMENT: ?  ?CLINICAL IMPRESSION: ?Patient returns to clinic reporting he has continued to notice improvement so far with pain, now not having constant soreness and only in sitting. Pt session focused on stretching and manual work at [place] abdominal region and pelvic floor for improved tissue release and mobility and decreased pain. Pt tolerated well and reported he felt like manual today was very similar to his pain/soreness and feels this continues to be helpful. Pt had improved tissue mobility this session compared to last with no tightness or soreness at Lt and post treatment pt reported significant decrease in pain at abdominal area and Rt pelvis.Pt would benefit from additional PT to further address deficits.   ?  ?  ?OBJECTIVE IMPAIRMENTS decreased coordination, decreased endurance, decreased strength, increased fascial restrictions, increased muscle spasms, impaired flexibility, improper body mechanics, postural  dysfunction, and pain.  ?  ?ACTIVITY LIMITATIONS community activity and intercourse .  ?  ?PERSONAL FACTORS 1 comorbidity: trauma to perineum    are also affecting patient's functional outcome.  ?  ?  ?REHAB POTENTIAL: Good ?  ?CLINICAL DECISION MAKING: Stable/uncomplicated ?  ?EVALUATION COMPLEXITY: Low ?  ?  ?GOALS: ?Goals reviewed with patient? Yes ?  ?SHORT TERM GOALS: Target date: 10/08/2021 ?  ?Pt to be I with HEP. ?Baseline: ?Goal status: INITIAL ?  ?2.  Pt will report  reduction of pain to no more than 2/10 due to improvements in posture, strength, and muscle length  ?Baseline:  ?Goal status: INITIAL ?  ?  ?  ?LONG TERM GOALS: Target date: 12/10/2021 ?  ?Pt to be I with advanced HEP. ?Baseline:  ?Goal status: INITIAL ?  ?2.  Pt will report  reduction of pain to no  more than 1/10 due to improvements in posture, strength, and muscle length  ?Baseline:  ?Goal status: INITIAL ?  ?3.  Pt to demonstrate good ability with minimal cues to contract/relax/bulge pelvic floor for improved ability to relax pelvic floor and decrease pain. ?Baseline:  ?Goal status: INITIAL ?  ?4.  Pt to demonstrate no fascial restrictions in abdomen with implementation of relaxation techniques and stretching.  ?Baseline:  ?Goal status: INITIAL ?  ?  ?  ?  ?PLAN: ?PT FREQUENCY: 1x/week ?  ?PT DURATION:  8 sessions ?  ?PLANNED INTERVENTIONS: Therapeutic exercises, Therapeutic activity, Neuromuscular re-education, Patient/Family education, Joint mobilization, Dry Needling, Spinal mobilization, Cryotherapy, Moist heat, Manual lymph drainage, scar mobilization, Taping, Biofeedback, and Manual therapy ?  ?PLAN FOR NEXT SESSION: manual at abdomen, relaxation techniques, manual work at perineum  ? ? ?Stacy Gardner, PT, DPT ?05/15/238:44 AM  ? ?

## 2021-10-06 ENCOUNTER — Ambulatory Visit: Payer: BC Managed Care – PPO | Admitting: Physical Therapy

## 2021-10-06 DIAGNOSIS — M62838 Other muscle spasm: Secondary | ICD-10-CM

## 2021-10-06 DIAGNOSIS — R293 Abnormal posture: Secondary | ICD-10-CM | POA: Diagnosis not present

## 2021-10-06 DIAGNOSIS — R279 Unspecified lack of coordination: Secondary | ICD-10-CM | POA: Diagnosis not present

## 2021-10-06 DIAGNOSIS — M6281 Muscle weakness (generalized): Secondary | ICD-10-CM | POA: Diagnosis not present

## 2021-10-06 NOTE — Therapy (Signed)
OUTPATIENT PHYSICAL THERAPY TREATMENT NOTE   Patient Name: Phillip Mcclain MRN: [id number] DOB:[birth year], 31 y.o., male Today's Date: 10/06/2021  PCP: Shelda Pal, DO REFERRING PROVIDER: Shelda Pal*  END OF SESSION:   PT End of Session - 10/06/21 0843     Visit Number 5    Date for PT Re-Evaluation 12/10/21    Authorization Type BCBS    PT Start Time 0801    PT Stop Time 0841    PT Time Calculation (min) 40 min    Activity Tolerance Patient tolerated treatment well    Behavior During Therapy [facility] for tasks assessed/performed               Past Medical History:  Diagnosis Date   Crohn's disease ([place])    Crohn's disease of intestine ([place]) 02/24/2005   Dizzy 09/14/2019   Postconcussion syndrome spring 2021   Post concussion syndrome 07/12/2019   Tinnitus 09/14/2019   Following concussion 2021   No past surgical history on file. Patient Active Problem List   Diagnosis Date Noted   Closed fracture of fifth toe of left foot 07/01/2021   Hallux rigidus of left foot 07/01/2021   Crohn's disease of intestine ([place]) 02/24/2005    REFERRING DIAG: R10.31,R10.32 (ICD-10-CM) - Bilateral groin pain  THERAPY DIAG:  Other muscle spasm  Unspecified lack of coordination  PERTINENT HISTORY: Chron's  Sexual abuse:  NO  PRECAUTIONS: none  SUBJECTIVE: Pt reports he is doing a lot better, "I spend most of the day without any pain at all. Which is complete opposite of how it started". Pt also reports no longer having pain with ejaculation, inconsistently does have dull ache with erection. Pt reports he doesn't feel like he no longer has pain after intercourse/working out or lifting anymore.   PAIN:  Are you having pain? Yes: NPRS scale: 1/10 Rt teste and slightly above it Pain description: dull ache    OBJECTIVE: (objective measures completed at initial evaluation unless otherwise dated)   OBJECTIVE:    DIAGNOSTIC FINDINGS:      COGNITION:             Overall cognitive status: Within functional limits for tasks assessed                          SENSATION:            Light touch: Appears intact            Proprioception: Appears intact   MUSCLE LENGTH: Bil adductors limited by 50% Bil Hamstrings limited by 25%    POSTURE:  Mild posterior pelvic tilt   LUMBARAROM/PROM   WFL   LE AROM/PROM:   WFL   LE MMT:   Bil hip abduction causes pain at groin and 4/5; all others 4+/5   PELVIC MMT: deferred   MMT   09/10/2021  Internal Anal Sphincter    External Anal Sphincter    Puborectalis    Diastasis Recti    (Blank rows = not tested)   PALPATION: GENERAL moderate fascial tightness felt in all abdominal quadrants               External Perineal Exam WFL, no redness. However did have TTP at bil proximal adductors Lt>Rt with and mild TTP at perineal body, bil medial glute max, levator ani, and pubococcygeus               Internal Pelvic Floor deferred at pt  request   TONE: deferred   TODAY'S TREATMENT    10/06/2021: 2x30s Rt adductor, abductor, hamstring stretches with strap Adductors rocks x10 Thoracic openers with lower trunk rotation 3x30s each Deep squat 2x30s Manual soft tissue and fascial release completed at Rt adductor proximally to improve tissue mobility, manual lower Rt abdominal quadrant, Rt superficial transverse. Pt had no tightness felt at Lt side and focus of treatment at Rt.    09/29/2021: Foam roller: bil glutes, bil hip abductors, hip flexors into lower abdominals x10 Adductors rocks x10 Frog stretch 2x45s Thoracic openers with lower trunk rotation 3x30s each Trunk rotations x10 Manual work at lower abdominal quadrants with fascial release technique direct method completed with good effect. Manual soft tissue and fascial release completed at Rt adductor proximally and suction cup used here to improve tissue mobility, manual without suction cup at Rt superficial transverse. Pt had no tightness felt at  Lt side and focus of treatment at Rt.   09/25/2021: 2x30s Rt adductor, abductor, hamstring stretches with strap 2x30s cobra and 2x30s trunk rotation Rt  Manual work at [place] lower abdominal quadrant, Rt pectineus, Rt proximal/medial adductor, ischiocavernosus, bil superficial transverse. Fascial release Rt oblique, anterior and mid lower abdominal quadrants.   09/16/2021:  Manual work at [employer] lower abdominal quadrant, Rt hip flexor, Rt proximal adductor, ischiocavernosus, superficial transverse and fascial release throughout Rt lower abdominal quadrant and Rt anterior pelvis.  X10 pelvic tilts on foam roller in sitting straddling foam roller - ant/post and Rt/Lt.  09/10/2021 EVAL Examination completed, findings reviewed, pt educated on POC, HEP, and pelvic relaxation techniques. Pt motivated to participate in PT and agreeable to attempt recommendations. Manual work completed for UG diaphragm release and at bil proximal adductors to attempt to release tightness and decrease pain. Pt reported not feeling as tight but didn't think mild pain was relieved at time of eval/treatment.        PATIENT EDUCATION:  Education details: [id number] Person educated: Patient Education method: Consulting civil engineer, Demonstration, Corporate treasurer cues, Verbal cues, and Handouts Education comprehension: verbalized understanding and returned demonstration     HOME EXERCISE PROGRAM: [id number]   ASSESSMENT:   CLINICAL IMPRESSION: Patient returns to clinic reporting he has continued to notice improvement so far with pain. Pt session focused on stretching and manual work at [place] abdominal region and pelvic floor for improved tissue release and mobility and decreased pain. Pt had improved tissue mobility this session compared to last with no tightness or soreness at Lt, overall pt had less tightness at Rt side compared to last session and reported he has been feeling better but plans to paintball this weekend and see how he feels after this. Pt  would benefit from additional PT to further address deficits.       OBJECTIVE IMPAIRMENTS decreased coordination, decreased endurance, decreased strength, increased fascial restrictions, increased muscle spasms, impaired flexibility, improper body mechanics, postural dysfunction, and pain.    ACTIVITY LIMITATIONS community activity and intercourse .    PERSONAL FACTORS 1 comorbidity: trauma to perineum    are also affecting patient's functional outcome.      REHAB POTENTIAL: Good   CLINICAL DECISION MAKING: Stable/uncomplicated   EVALUATION COMPLEXITY: Low     GOALS: Goals reviewed with patient? Yes   SHORT TERM GOALS: Target date: 10/08/2021   Pt to be I with HEP. Baseline: Goal status: MET   2.  Pt will report  reduction of pain to no more than 2/10 due to improvements in posture, strength, and muscle  length  Baseline:  Goal status: MET       LONG TERM GOALS: Target date: 12/10/2021   Pt to be I with advanced HEP. Baseline:  Goal status: INITIAL   2.  Pt will report  reduction of pain to no more than 1/10 due to improvements in posture, strength, and muscle length  Baseline:  Goal status: INITIAL   3.  Pt to demonstrate good ability with minimal cues to contract/relax/bulge pelvic floor for improved ability to relax pelvic floor and decrease pain. Baseline:  Goal status: INITIAL   4.  Pt to demonstrate no fascial restrictions in abdomen with implementation of relaxation techniques and stretching.  Baseline:  Goal status: INITIAL         PLAN: PT FREQUENCY: 1x/week   PT DURATION:  8 sessions   PLANNED INTERVENTIONS: Therapeutic exercises, Therapeutic activity, Neuromuscular re-education, Patient/Family education, Joint mobilization, Dry Needling, Spinal mobilization, Cryotherapy, Moist heat, Manual lymph drainage, scar mobilization, Taping, Biofeedback, and Manual therapy   PLAN FOR NEXT SESSION: manual at abdomen, relaxation techniques, manual work at  perineum    Stacy Gardner, PT, DPT 05/22/238:44 AM

## 2021-10-14 ENCOUNTER — Ambulatory Visit: Payer: BC Managed Care – PPO | Admitting: Physical Therapy

## 2021-10-14 DIAGNOSIS — R279 Unspecified lack of coordination: Secondary | ICD-10-CM | POA: Diagnosis not present

## 2021-10-14 DIAGNOSIS — M6281 Muscle weakness (generalized): Secondary | ICD-10-CM

## 2021-10-14 DIAGNOSIS — M62838 Other muscle spasm: Secondary | ICD-10-CM

## 2021-10-14 DIAGNOSIS — R293 Abnormal posture: Secondary | ICD-10-CM | POA: Diagnosis not present

## 2021-10-14 NOTE — Therapy (Signed)
OUTPATIENT PHYSICAL THERAPY TREATMENT NOTE   Patient Name: Phillip Mcclain MRN: [id number] DOB:Sep 07, 1990, 31 y.o., male Today's Date: 10/14/2021  PCP: Shelda Pal, DO REFERRING PROVIDER: Shelda Pal*  END OF SESSION:   PT End of Session - 10/14/21 0803     Visit Number 6    Date for PT Re-Evaluation 12/10/21    Authorization Type BCBS    PT Start Time 0802    PT Stop Time 0840    PT Time Calculation (min) 38 min    Activity Tolerance Patient tolerated treatment well    Behavior During Therapy [facility] for tasks assessed/performed               Past Medical History:  Diagnosis Date   Crohn's disease ([place])    Crohn's disease of intestine ([place]) 02/24/2005   Dizzy 09/14/2019   Postconcussion syndrome spring 2021   Post concussion syndrome 07/12/2019   Tinnitus 09/14/2019   Following concussion 2021   No past surgical history on file. Patient Active Problem List   Diagnosis Date Noted   Closed fracture of fifth toe of left foot 07/01/2021   Hallux rigidus of left foot 07/01/2021   Crohn's disease of intestine ([place]) 02/24/2005    REFERRING DIAG: R10.31,R10.32 (ICD-10-CM) - Bilateral groin pain  THERAPY DIAG:  Muscle weakness (generalized)  Other muscle spasm  Unspecified lack of coordination  PERTINENT HISTORY: Chron's  Sexual abuse:  NO  PRECAUTIONS: none  SUBJECTIVE: Pt reports he continues to feel a lot better but yesterday/this morning is having soreness at bil groin and Rt teste and unsure what has caused this. Unable to play paintball this week,but did drive to/from [place] and noticed some soreness after that. Did have pain-free intercourse and ejaculation this past week.   PAIN:  Are you having pain? Yes: NPRS scale: 3/10 Rt teste and bil groin Pain description: dull ache    OBJECTIVE: (objective measures completed at initial evaluation unless otherwise dated)   OBJECTIVE:    DIAGNOSTIC FINDINGS:      COGNITION:             Overall cognitive status: Within functional limits for tasks assessed                          SENSATION:            Light touch: Appears intact            Proprioception: Appears intact   MUSCLE LENGTH: Bil adductors limited by 50% Bil Hamstrings limited by 25%    POSTURE:  Mild posterior pelvic tilt   LUMBARAROM/PROM   WFL   LE AROM/PROM:   WFL   LE MMT:   Bil hip abduction causes pain at groin and 4/5; all others 4+/5   PELVIC MMT: deferred   MMT   09/10/2021  Internal Anal Sphincter    External Anal Sphincter    Puborectalis    Diastasis Recti    (Blank rows = not tested)   PALPATION: GENERAL moderate fascial tightness felt in all abdominal quadrants               External Perineal Exam WFL, no redness. However did have TTP at bil proximal adductors Lt>Rt with and mild TTP at perineal body, bil medial glute max, levator ani, and pubococcygeus               Internal Pelvic Floor deferred at pt request   TONE: deferred  TODAY'S TREATMENT    10/14/2021: 2x30s Rt adductor, abductor, hamstring stretches with strap Thoracic openers with lower trunk rotation 3x30s each Sidelying thoracic side bend on foam roller 2x30s Rt side Manual soft tissue and fascial release completed at lower Rt abdominal quadrant, Rt external oblique into anterior abdomen. Pt had no tightness felt at Lt side and focus of treatment at Rt.    10/06/2021: 2x30s Rt adductor, abductor, hamstring stretches with strap Adductors rocks x10 Thoracic openers with lower trunk rotation 3x30s each Deep squat 2x30s Manual soft tissue and fascial release completed at Rt adductor proximally to improve tissue mobility, manual lower Rt abdominal quadrant, Rt superficial transverse. Pt had no tightness felt at Lt side and focus of treatment at Rt.    09/29/2021: Foam roller: bil glutes, bil hip abductors, hip flexors into lower abdominals x10 Adductors rocks x10 Frog stretch 2x45s Thoracic openers with  lower trunk rotation 3x30s each Trunk rotations x10 Manual work at lower abdominal quadrants with fascial release technique direct method completed with good effect. Manual soft tissue and fascial release completed at Rt adductor proximally and suction cup used here to improve tissue mobility, manual without suction cup at Rt superficial transverse. Pt had no tightness felt at Lt side and focus of treatment at Rt.   09/25/2021: 2x30s Rt adductor, abductor, hamstring stretches with strap 2x30s cobra and 2x30s trunk rotation Rt  Manual work at [place] lower abdominal quadrant, Rt pectineus, Rt proximal/medial adductor, ischiocavernosus, bil superficial transverse. Fascial release Rt oblique, anterior and mid lower abdominal quadrants.   09/16/2021:  Manual work at [employer] lower abdominal quadrant, Rt hip flexor, Rt proximal adductor, ischiocavernosus, superficial transverse and fascial release throughout Rt lower abdominal quadrant and Rt anterior pelvis.  X10 pelvic tilts on foam roller in sitting straddling foam roller - ant/post and Rt/Lt.  09/10/2021 EVAL Examination completed, findings reviewed, pt educated on POC, HEP, and pelvic relaxation techniques. Pt motivated to participate in PT and agreeable to attempt recommendations. Manual work completed for UG diaphragm release and at bil proximal adductors to attempt to release tightness and decrease pain. Pt reported not feeling as tight but didn't think mild pain was relieved at time of eval/treatment.        PATIENT EDUCATION:  Education details: [id number] Person educated: Patient Education method: Consulting civil engineer, Demonstration, Corporate treasurer cues, Verbal cues, and Handouts Education comprehension: verbalized understanding and returned demonstration     HOME EXERCISE PROGRAM: [id number]   ASSESSMENT:   CLINICAL IMPRESSION: Patient has continued improvement with pain but does note soreness this morning. Session focused on stretching at bil hips, spine and  pelvic floor and manual at Rt lower abdominal fascia, Rt external pelvic floor at superficial muscle layer and rt teste with pt's consent. Pt does demonstrate improved tolerance to mobility, decreased pain overall but does have some remaining tension at these areas. Pt would benefit from additional PT to further address deficits.       OBJECTIVE IMPAIRMENTS decreased coordination, decreased endurance, decreased strength, increased fascial restrictions, increased muscle spasms, impaired flexibility, improper body mechanics, postural dysfunction, and pain.    ACTIVITY LIMITATIONS community activity and intercourse .    PERSONAL FACTORS 1 comorbidity: trauma to perineum    are also affecting patient's functional outcome.      REHAB POTENTIAL: Good   CLINICAL DECISION MAKING: Stable/uncomplicated   EVALUATION COMPLEXITY: Low     GOALS: Goals reviewed with patient? Yes   SHORT TERM GOALS: Target date: 10/08/2021   Pt to be I  with HEP. Baseline: Goal status: MET   2.  Pt will report  reduction of pain to no more than 2/10 due to improvements in posture, strength, and muscle length  Baseline:  Goal status: MET       LONG TERM GOALS: Target date: 12/10/2021   Pt to be I with advanced HEP. Baseline:  Goal status: INITIAL   2.  Pt will report  reduction of pain to no more than 1/10 due to improvements in posture, strength, and muscle length  Baseline:  Goal status: INITIAL   3.  Pt to demonstrate good ability with minimal cues to contract/relax/bulge pelvic floor for improved ability to relax pelvic floor and decrease pain. Baseline:  Goal status: INITIAL   4.  Pt to demonstrate no fascial restrictions in abdomen with implementation of relaxation techniques and stretching.  Baseline:  Goal status: INITIAL         PLAN: PT FREQUENCY: 1x/week   PT DURATION:  8 sessions   PLANNED INTERVENTIONS: Therapeutic exercises, Therapeutic activity, Neuromuscular re-education,  Patient/Family education, Joint mobilization, Dry Needling, Spinal mobilization, Cryotherapy, Moist heat, Manual lymph drainage, scar mobilization, Taping, Biofeedback, and Manual therapy   PLAN FOR NEXT SESSION: manual at abdomen, relaxation techniques, manual work at perineum    Stacy Gardner, PT, DPT 05/30/238:43 AM

## 2021-10-20 ENCOUNTER — Ambulatory Visit: Payer: BC Managed Care – PPO | Admitting: Physical Therapy

## 2021-10-27 ENCOUNTER — Ambulatory Visit: Payer: BC Managed Care – PPO | Attending: Family Medicine | Admitting: Physical Therapy

## 2021-10-27 DIAGNOSIS — R279 Unspecified lack of coordination: Secondary | ICD-10-CM | POA: Diagnosis not present

## 2021-10-27 DIAGNOSIS — R293 Abnormal posture: Secondary | ICD-10-CM | POA: Diagnosis not present

## 2021-10-27 DIAGNOSIS — M62838 Other muscle spasm: Secondary | ICD-10-CM | POA: Insufficient documentation
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
## 2021-11-10 ENCOUNTER — Encounter: Payer: BC Managed Care – PPO | Admitting: Physical Therapy

## 2022-01-06 DIAGNOSIS — K5000 Crohn's disease of small intestine without complications: Secondary | ICD-10-CM | POA: Diagnosis not present

## 2022-01-27 ENCOUNTER — Ambulatory Visit: Payer: BC Managed Care – PPO | Admitting: Family Medicine

## 2022-02-20 ENCOUNTER — Emergency Department (HOSPITAL_BASED_OUTPATIENT_CLINIC_OR_DEPARTMENT_OTHER)
Admission: EM | Admit: 2022-02-20 | Discharge: 2022-02-20 | Disposition: A | Discharge status: Home / Self Care | Payer: BC Managed Care – PPO | Attending: Emergency Medicine | Admitting: Emergency Medicine

## 2022-02-20 ENCOUNTER — Encounter (HOSPITAL_BASED_OUTPATIENT_CLINIC_OR_DEPARTMENT_OTHER): Payer: Self-pay | Admitting: Urology

## 2022-02-20 ENCOUNTER — Emergency Department (HOSPITAL_BASED_OUTPATIENT_CLINIC_OR_DEPARTMENT_OTHER): Payer: BC Managed Care – PPO

## 2022-02-20 ENCOUNTER — Other Ambulatory Visit: Payer: Self-pay

## 2022-02-20 DIAGNOSIS — S8012XA Contusion of left lower leg, initial encounter: Secondary | ICD-10-CM | POA: Diagnosis not present

## 2022-02-20 DIAGNOSIS — S0990XA Unspecified injury of head, initial encounter: Secondary | ICD-10-CM | POA: Diagnosis not present

## 2022-02-20 DIAGNOSIS — S161XXA Strain of muscle, fascia and tendon at neck level, initial encounter: Secondary | ICD-10-CM | POA: Insufficient documentation

## 2022-02-20 DIAGNOSIS — T1490XA Injury, unspecified, initial encounter: Secondary | ICD-10-CM | POA: Diagnosis not present

## 2022-02-20 DIAGNOSIS — S5012XA Contusion of left forearm, initial encounter: Secondary | ICD-10-CM | POA: Insufficient documentation

## 2022-02-20 DIAGNOSIS — Y92410 Unspecified street and highway as the place of occurrence of the external cause: Secondary | ICD-10-CM | POA: Diagnosis not present

## 2022-02-20 DIAGNOSIS — S199XXA Unspecified injury of neck, initial encounter: Secondary | ICD-10-CM | POA: Diagnosis not present

## 2022-02-20 DIAGNOSIS — K5090 Crohn's disease, unspecified, without complications: Secondary | ICD-10-CM | POA: Insufficient documentation

## 2022-02-20 DIAGNOSIS — D72829 Elevated white blood cell count, unspecified: Secondary | ICD-10-CM | POA: Insufficient documentation

## 2022-02-20 DIAGNOSIS — M25512 Pain in left shoulder: Secondary | ICD-10-CM | POA: Diagnosis not present

## 2022-02-20 DIAGNOSIS — S20212A Contusion of left front wall of thorax, initial encounter: Secondary | ICD-10-CM | POA: Insufficient documentation

## 2022-02-20 DIAGNOSIS — M79605 Pain in left leg: Secondary | ICD-10-CM | POA: Diagnosis not present

## 2022-02-20 DIAGNOSIS — M79602 Pain in left arm: Secondary | ICD-10-CM | POA: Diagnosis not present

## 2022-02-20 DIAGNOSIS — Z041 Encounter for examination and observation following transport accident: Secondary | ICD-10-CM | POA: Diagnosis not present

## 2022-02-20 LAB — COMPREHENSIVE METABOLIC PANEL
ALT: 14 U/L (ref 0–44)
AST: 21 U/L (ref 15–41)
Albumin: 4.4 g/dL (ref 3.5–5.0)
Alkaline Phosphatase: 54 U/L (ref 38–126)
Anion gap: 7 (ref 5–15)
BUN: 22 mg/dL — ABNORMAL HIGH (ref 6–20)
CO2: 26 mmol/L (ref 22–32)
Calcium: 9.1 mg/dL (ref 8.9–10.3)
Chloride: 104 mmol/L (ref 98–111)
Creatinine, Ser: 1.1 mg/dL (ref 0.61–1.24)
GFR, Estimated: >60 mL/min (ref >60)
Glucose, Bld: 96 mg/dL (ref 70–99)
Potassium: 3.9 mmol/L (ref 3.5–5.1)
Sodium: 137 mmol/L (ref 135–145)
Total Bilirubin: 0.4 mg/dL (ref 0.3–1.2)
Total Protein: 7.5 g/dL (ref 6.5–8.1)

## 2022-02-20 LAB — CBC WITH DIFFERENTIAL/PLATELET
Abs Immature Granulocytes: 0.03 10*3/uL (ref 0.00–0.07)
Basophils Absolute: 0 10*3/uL (ref 0.0–0.1)
Basophils Relative: 0 %
Eosinophils Absolute: 0.1 10*3/uL (ref 0.0–0.5)
Eosinophils Relative: 1 %
HCT: 40.9 % (ref 39.0–52.0)
Hemoglobin: 14 g/dL (ref 13.0–17.0)
Immature Granulocytes: 0 %
Lymphocytes Relative: 24 %
Lymphs Abs: 2.6 10*3/uL (ref 0.7–4.0)
MCH: 29.7 pg (ref 26.0–34.0)
MCHC: 34.2 g/dL (ref 30.0–36.0)
MCV: 86.7 fL (ref 80.0–100.0)
Monocytes Absolute: 0.9 10*3/uL (ref 0.1–1.0)
Monocytes Relative: 9 %
Neutro Abs: 7.2 10*3/uL (ref 1.7–7.7)
Neutrophils Relative %: 66 %
Platelets: 297 10*3/uL (ref 150–400)
RBC: 4.72 MIL/uL (ref 4.22–5.81)
RDW: 12.2 % (ref 11.5–15.5)
WBC: 10.8 10*3/uL — ABNORMAL HIGH (ref 4.0–10.5)
nRBC: 0 % (ref 0.0–0.2)

## 2022-02-20 MED ORDER — IOHEXOL 300 MG/ML  SOLN
100.0000 mL | Freq: Once | INTRAMUSCULAR | Status: AC | PRN
  Administered 2022-02-20: 100 mL via INTRAVENOUS

## 2022-02-20 MED ORDER — METHOCARBAMOL 500 MG PO TABS: 500.0000 mg | ORAL_TABLET | Freq: Three times a day (TID) | ORAL | 0 refills | Status: DC | PRN

## 2022-02-20 MED ORDER — DICLOFENAC SODIUM 1 % EX GEL: 2.0000 g | Freq: Four times a day (QID) | CUTANEOUS | 0 refills | Status: DC | PRN

## 2022-02-20 MED ORDER — IBUPROFEN 800 MG PO TABS: 800.0000 mg | ORAL_TABLET | Freq: Three times a day (TID) | ORAL | 0 refills | Status: DC | PRN

## 2022-02-20 MED ORDER — TETANUS-DIPHTH-ACELL PERTUSSIS 5-2.5-18.5 LF-MCG/0.5 IM SUSY
0.5000 mL | PREFILLED_SYRINGE | Freq: Once | INTRAMUSCULAR | Status: DC
  Filled 2022-02-20: qty 0.5
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
## 2022-02-20 NOTE — ED Notes (Signed)
Patient transported to CT 

## 2022-02-20 NOTE — Discharge Instructions (Signed)

## 2022-02-25 ENCOUNTER — Ambulatory Visit (INDEPENDENT_AMBULATORY_CARE_PROVIDER_SITE_OTHER): Payer: BC Managed Care – PPO | Admitting: Family Medicine

## 2022-02-25 VITALS — BP 106/72 | HR 63 | Ht 65.0 in | Wt 117.0 lb

## 2022-02-25 DIAGNOSIS — M542 Cervicalgia: Secondary | ICD-10-CM

## 2022-02-25 NOTE — Patient Instructions (Signed)
Thank you for coming in today.   Let me know if you need PT.   Recheck as needed.   Advance activity as tolerated.
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
## 2022-07-06 DIAGNOSIS — K5000 Crohn's disease of small intestine without complications: Secondary | ICD-10-CM | POA: Diagnosis not present

## 2022-07-13 ENCOUNTER — Telehealth: Payer: Self-pay | Admitting: Family Medicine

## 2022-07-13 DIAGNOSIS — Z91199 Patient's noncompliance with other medical treatment and regimen due to unspecified reason: Secondary | ICD-10-CM

## 2022-07-13 NOTE — Progress Notes (Signed)
Unsuccessful in reaching patient despite several attempts.

## 2022-08-12 DIAGNOSIS — K5000 Crohn's disease of small intestine without complications: Secondary | ICD-10-CM | POA: Diagnosis not present

## 2022-12-29 ENCOUNTER — Ambulatory Visit (INDEPENDENT_AMBULATORY_CARE_PROVIDER_SITE_OTHER): Payer: BC Managed Care – PPO | Admitting: Sports Medicine

## 2022-12-29 VITALS — HR 78 | Ht 65.0 in | Wt 113.0 lb

## 2022-12-29 DIAGNOSIS — G8929 Other chronic pain: Secondary | ICD-10-CM | POA: Diagnosis not present

## 2022-12-29 DIAGNOSIS — M25561 Pain in right knee: Secondary | ICD-10-CM | POA: Diagnosis not present

## 2022-12-29 MED ORDER — MELOXICAM 15 MG PO TABS: 15.0000 mg | ORAL_TABLET | Freq: Every day | ORAL | 0 refills | Status: DC

## 2022-12-29 NOTE — Progress Notes (Addendum)
Phillip Mcclain D.[facility] [address] [place] [address] Phone: [phone]   Assessment and Plan:     1. Chronic pain of right knee -Chronic with exacerbation, initial sports medicine visit - Consistent with contusion versus partial tearing of distal lateral quadriceps based on HPI physical exam with history of intermittent right knee pain - No red flag symptoms, so no imaging at today's visit - Start meloxicam 15 mg daily x2 weeks.  If still having pain after 2 weeks, complete 3rd-week of meloxicam. May use remaining meloxicam as needed once daily for pain control.  Do not to use additional NSAIDs while taking meloxicam.  May use Tylenol [phone] mg 2 to 3 times a day for breakthrough pain. - Start HEP for knee and quadriceps  Other orders - meloxicam (MOBIC) 15 MG tablet; Take 1 tablet (15 mg total) by mouth daily.    Pertinent previous records reviewed include none   Follow Up: 4 weeks for reevaluation prior to paintball tournament for possible clearance.   Subjective:   I, Phillip Mcclain, am serving as a Neurosurgeon for Doctor Richardean Sale  Chief Complaint: right knee pain   HPI:   12/29/22 Patient is a 32 year old male complaining of right knee pain. Patient states that he was playing paintball. He slid on some bunched up turf 2 weeks ago. He did have some initial swelling. Lateral knee pain. Pain in his quad. Has a tournament in 4 weeks. Knee feels stiff. No antalgic gait. Pain when flexed for long period of time. No meds for the pain or intermittently.   Relevant Historical Information: Crohn's disease  Additional pertinent review of systems negative.   Current Outpatient Medications:    meloxicam (MOBIC) 15 MG tablet, Take 1 tablet (15 mg total) by mouth daily., Disp: 30 tablet, Rfl: 0   Adalimumab 40 MG/0.8ML PNKT, INJECT 40MG  SUBCUTANEOUSLY  EVERY 2 WEEKS, Disp: , Rfl:    diclofenac Sodium (VOLTAREN) 1 % GEL, Apply 2 g  topically 4 (four) times daily as needed., Disp: 100 g, Rfl: 0   ibuprofen (ADVIL) 800 MG tablet, Take 1 tablet (800 mg total) by mouth every 8 (eight) hours as needed for moderate pain., Disp: 21 tablet, Rfl: 0   methocarbamol (ROBAXIN) 500 MG tablet, Take 1 tablet (500 mg total) by mouth every 8 (eight) hours as needed for muscle spasms., Disp: 20 tablet, Rfl: 0   Objective:     Vitals:   12/29/22 0951  Pulse: 78  SpO2: 98%  Weight: 113 lb (51.3 kg)  Height: 5\' 5"  (1.651 m)      Body mass index is 18.8 kg/m.    Physical Exam:    General:  awake, alert oriented, no acute distress nontoxic Skin: no suspicious lesions or rashes Neuro:sensation intact and strength 5/5 with no deficits, no atrophy, normal muscle tone Psych: No signs of anxiety, depression or other mood disorder  Right knee: No swelling No deformity Resolving, yellowish ecchymosis around fibular head Neg fluid wave, joint milking ROM Flex 110, Ext 0 TTP lateral distal quadriceps tendon NTTP over the  medial fem condyle, lat fem condyle, patella, plica, patella tendon, tibial tuberostiy, fibular head, posterior fossa, pes anserine bursa, gerdy's tubercle, medial jt line, lateral jt line Neg anterior and posterior drawer Neg lachman Neg sag sign Negative varus stress Negative valgus stress Negative McMurray Negative Thessaly No pain or weakness with resisted knee flexion or extension No pain with single-leg squat  Gait  normal    Electronically signed by:  Phillip Mcclain D.[facility] 10:20 AM 12/29/22

## 2022-12-29 NOTE — Patient Instructions (Signed)
-   Start meloxicam 15 mg daily x2 weeks.  If still having pain after 2 weeks, complete 3rd-week of meloxicam. May use remaining meloxicam as needed once daily for pain control.  Do not to use additional NSAIDs while taking meloxicam.  May use Tylenol [phone] mg 2 to 3 times a day for breakthrough pain. Knee HEP 4 week follow up before tournament

## 2023-01-05 ENCOUNTER — Ambulatory Visit: Payer: BC Managed Care – PPO | Admitting: Family Medicine

## 2023-01-15 NOTE — Progress Notes (Unsigned)
    Aleen Sells D.[facility] [address] [place] [address] Phone: [phone]   Assessment and Plan:     There are no diagnoses linked to this encounter.  ***   Pertinent previous records reviewed include ***   Follow Up: ***     Subjective:   I, Atilla Zollner, am serving as a Neurosurgeon for Doctor Richardean Sale   Chief Complaint: right knee pain    HPI:    12/29/22 Patient is a 32 year old male complaining of right knee pain. Patient states that he was playing paintball. He slid on some bunched up turf 2 weeks ago. He did have some initial swelling. Lateral knee pain. Pain in his quad. Has a tournament in 4 weeks. Knee feels stiff. No antalgic gait. Pain when flexed for long period of time. No meds for the pain or intermittently.   01/19/2023 Patient states    Relevant Historical Information: Crohn's disease Additional pertinent review of systems negative.   Current Outpatient Medications:    Adalimumab 40 MG/0.8ML PNKT, INJECT 40MG  SUBCUTANEOUSLY  EVERY 2 WEEKS, Disp: , Rfl:    diclofenac Sodium (VOLTAREN) 1 % GEL, Apply 2 g topically 4 (four) times daily as needed., Disp: 100 g, Rfl: 0   ibuprofen (ADVIL) 800 MG tablet, Take 1 tablet (800 mg total) by mouth every 8 (eight) hours as needed for moderate pain., Disp: 21 tablet, Rfl: 0   meloxicam (MOBIC) 15 MG tablet, Take 1 tablet (15 mg total) by mouth daily., Disp: 30 tablet, Rfl: 0   methocarbamol (ROBAXIN) 500 MG tablet, Take 1 tablet (500 mg total) by mouth every 8 (eight) hours as needed for muscle spasms., Disp: 20 tablet, Rfl: 0   Objective:     There were no vitals filed for this visit.    There is no height or weight on file to calculate BMI.    Physical Exam:    ***   Electronically signed by:  Aleen Sells D.[facility] 7:09 AM 01/15/23

## 2023-01-19 ENCOUNTER — Ambulatory Visit (INDEPENDENT_AMBULATORY_CARE_PROVIDER_SITE_OTHER): Payer: BC Managed Care – PPO | Admitting: Sports Medicine

## 2023-01-19 VITALS — BP 110/74 | HR 75 | Ht 65.0 in

## 2023-01-19 DIAGNOSIS — G8929 Other chronic pain: Secondary | ICD-10-CM

## 2023-01-19 DIAGNOSIS — M25561 Pain in right knee: Secondary | ICD-10-CM

## 2023-02-09 DIAGNOSIS — K5000 Crohn's disease of small intestine without complications: Secondary | ICD-10-CM | POA: Diagnosis not present

## 2023-04-12 IMAGING — DX DG KNEE AP/LAT W/ SUNRISE*R*
3 series · 3 of 3 positions shown · non-contrast
Comparison: None.

CLINICAL DATA: Right knee pain.  No recent trauma.

EXAM:
RIGHT KNEE 3 VIEWS

[knee ap]
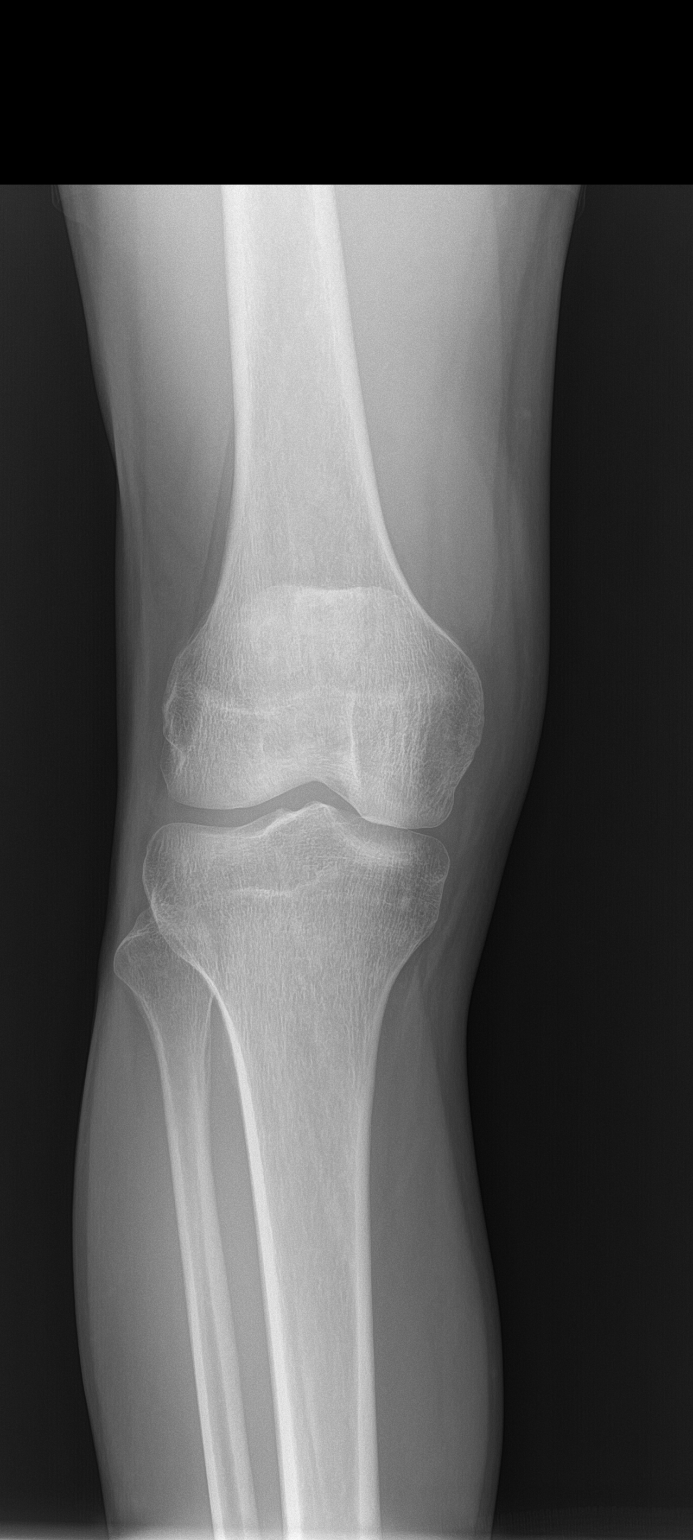

[knee lat]
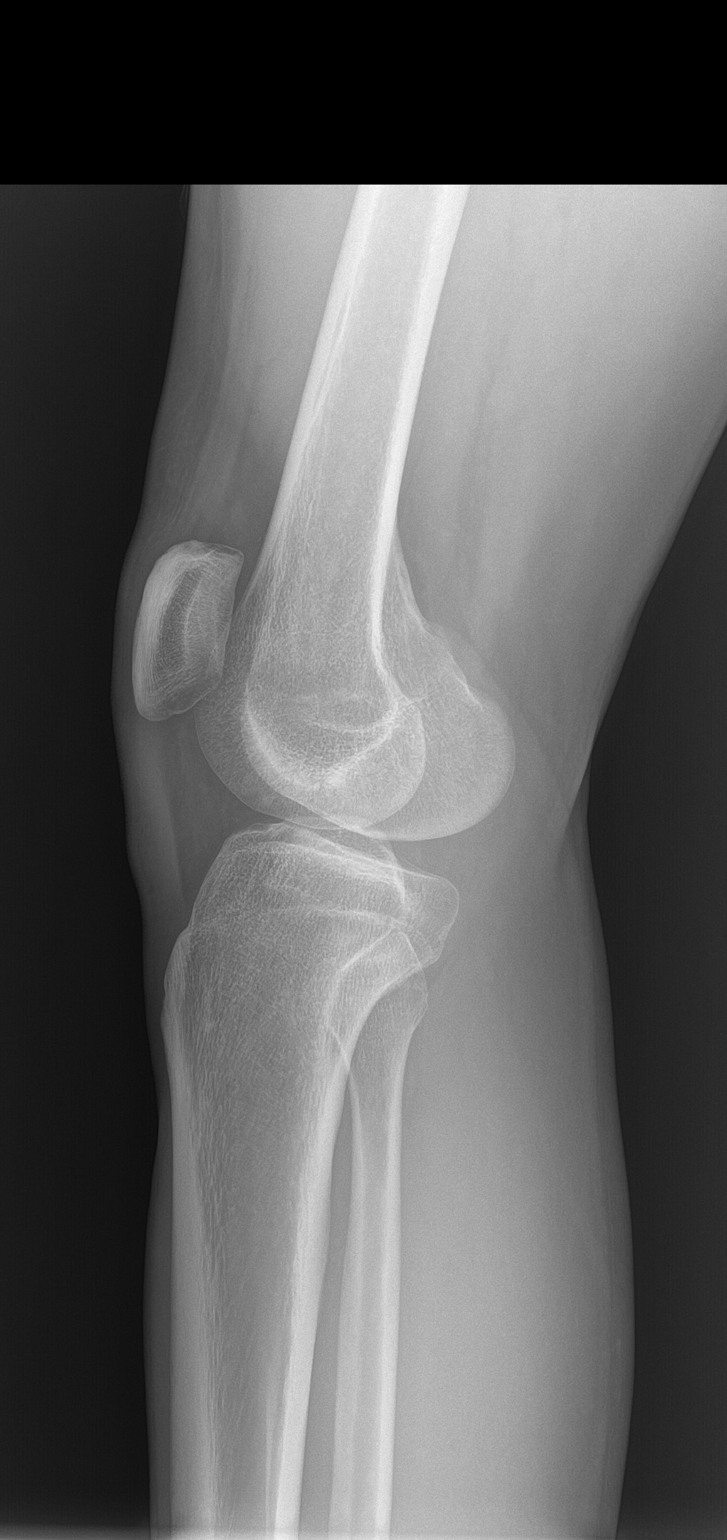

[patella]
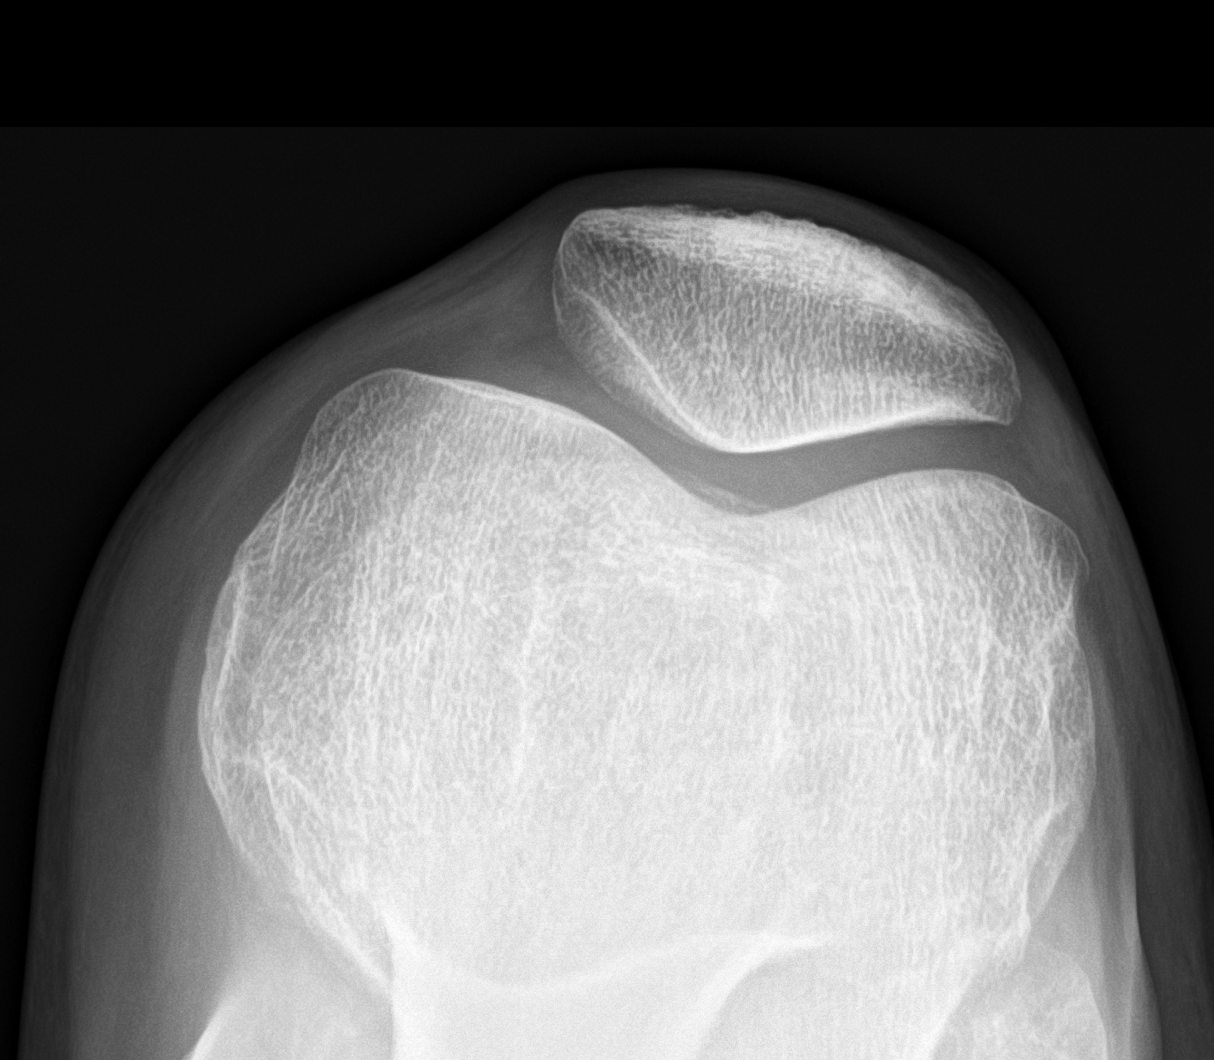

[3 of 3 positions shown; findings below may reference images not displayed]

FINDINGS: No evidence of fracture, dislocation, or joint effusion. No evidence
of arthropathy or other focal bone abnormality. Soft tissues are
unremarkable.
IMPRESSION: Negative.

## 2023-06-12 IMAGING — DX DG FOOT COMPLETE 3+V*L*
3 series · 3 of 3 positions shown · non-contrast
Comparison: None.

CLINICAL DATA: Left fifth digit foot pain for 2-3 weeks after
hitting it.

EXAM:
LEFT FOOT - COMPLETE 3+ VIEW

[foot ap]
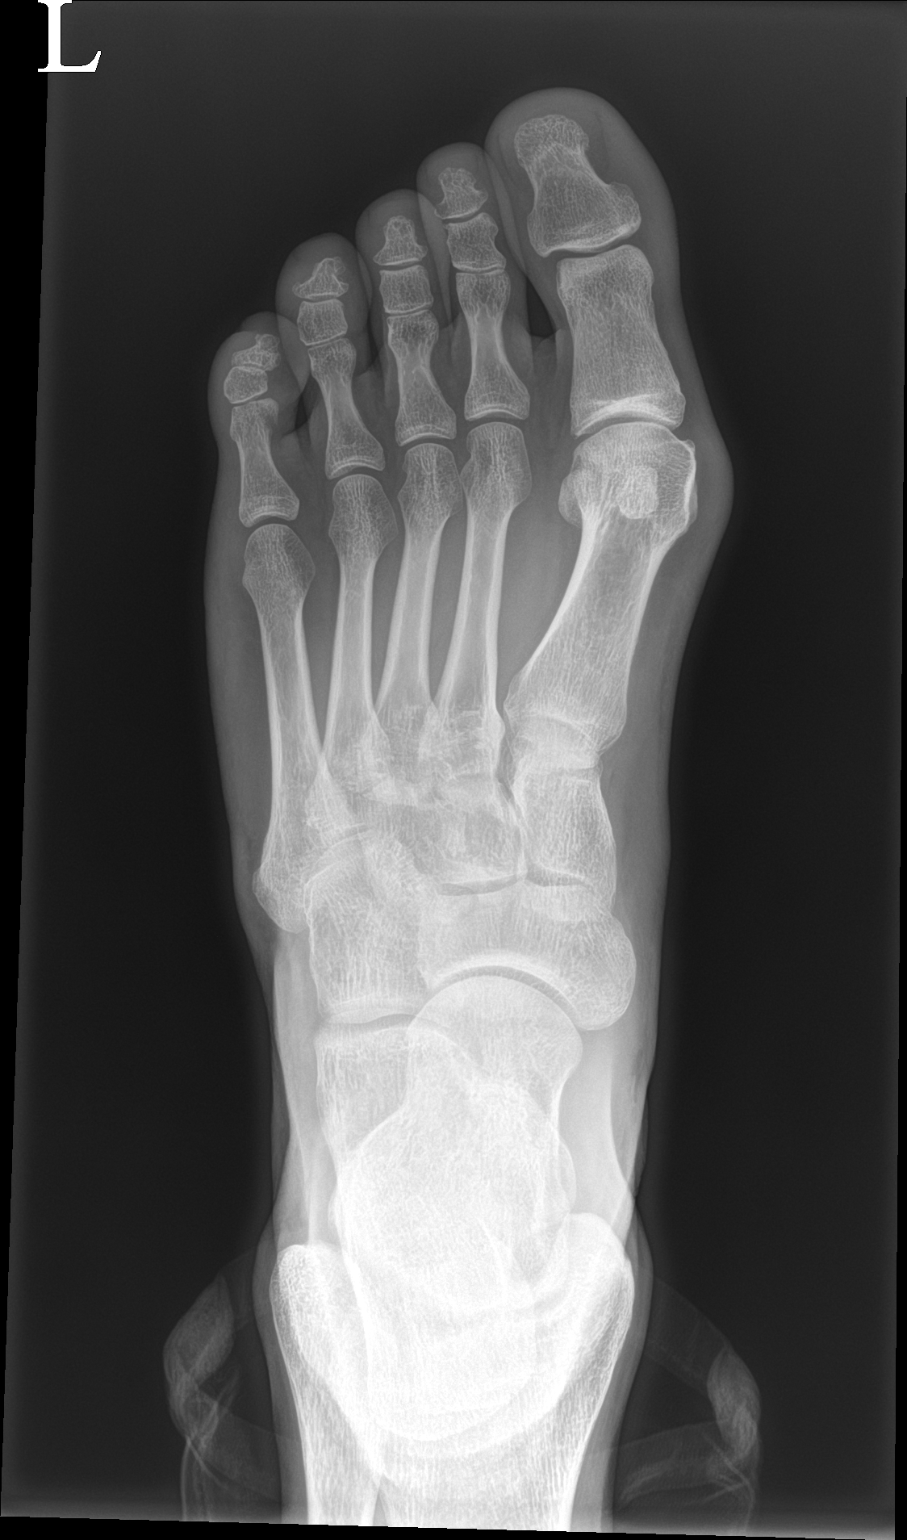

[foot obl]
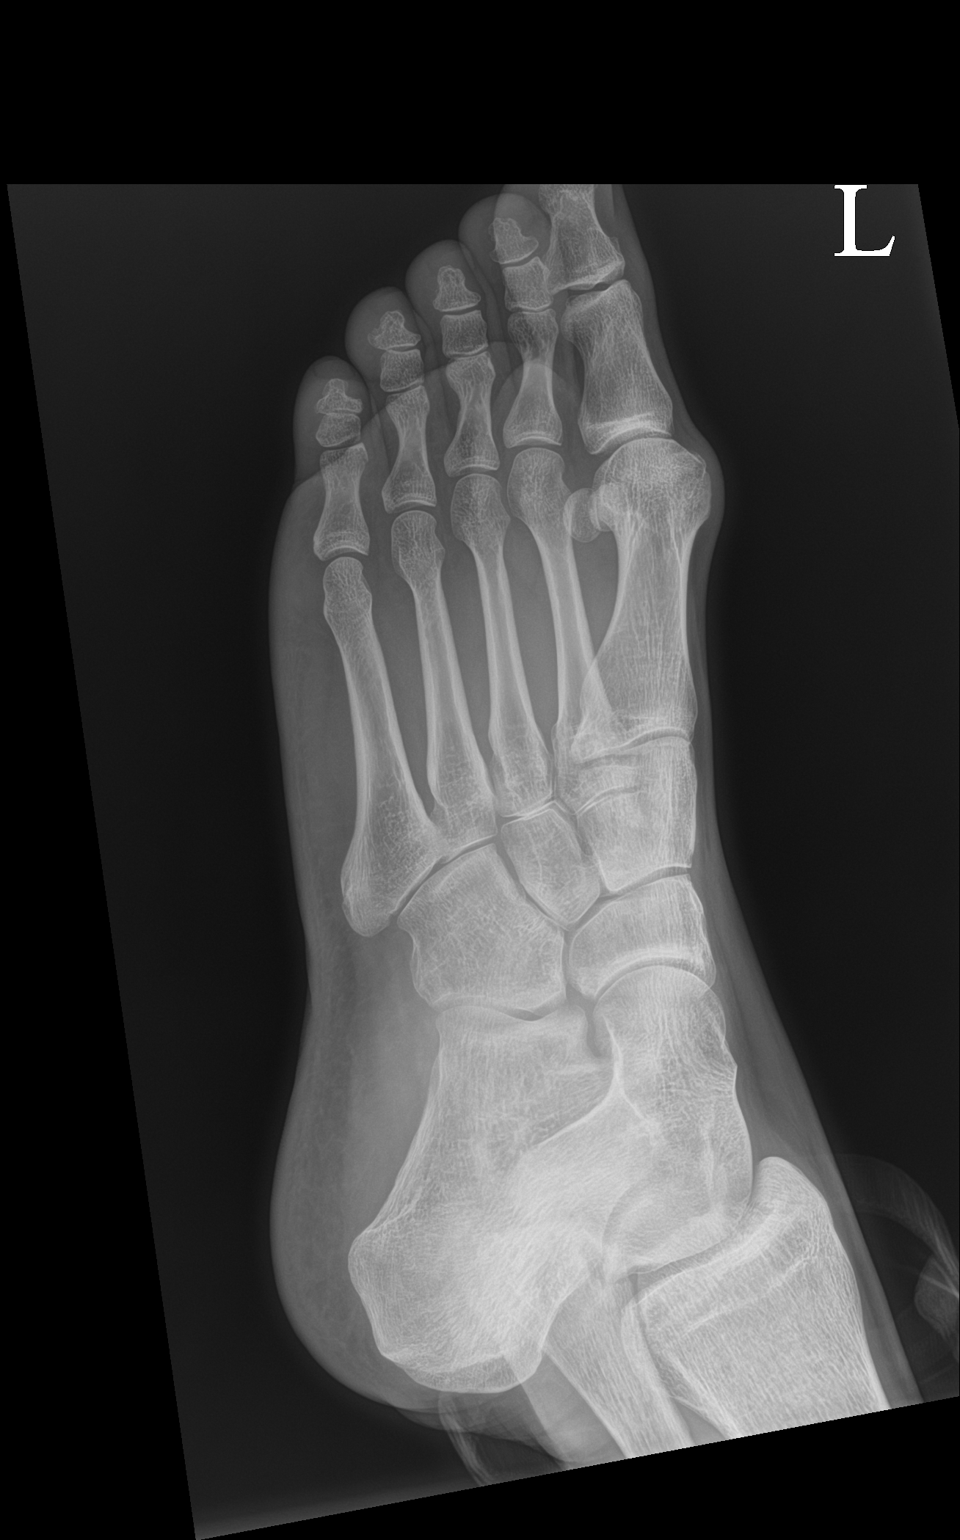

[foot lat]
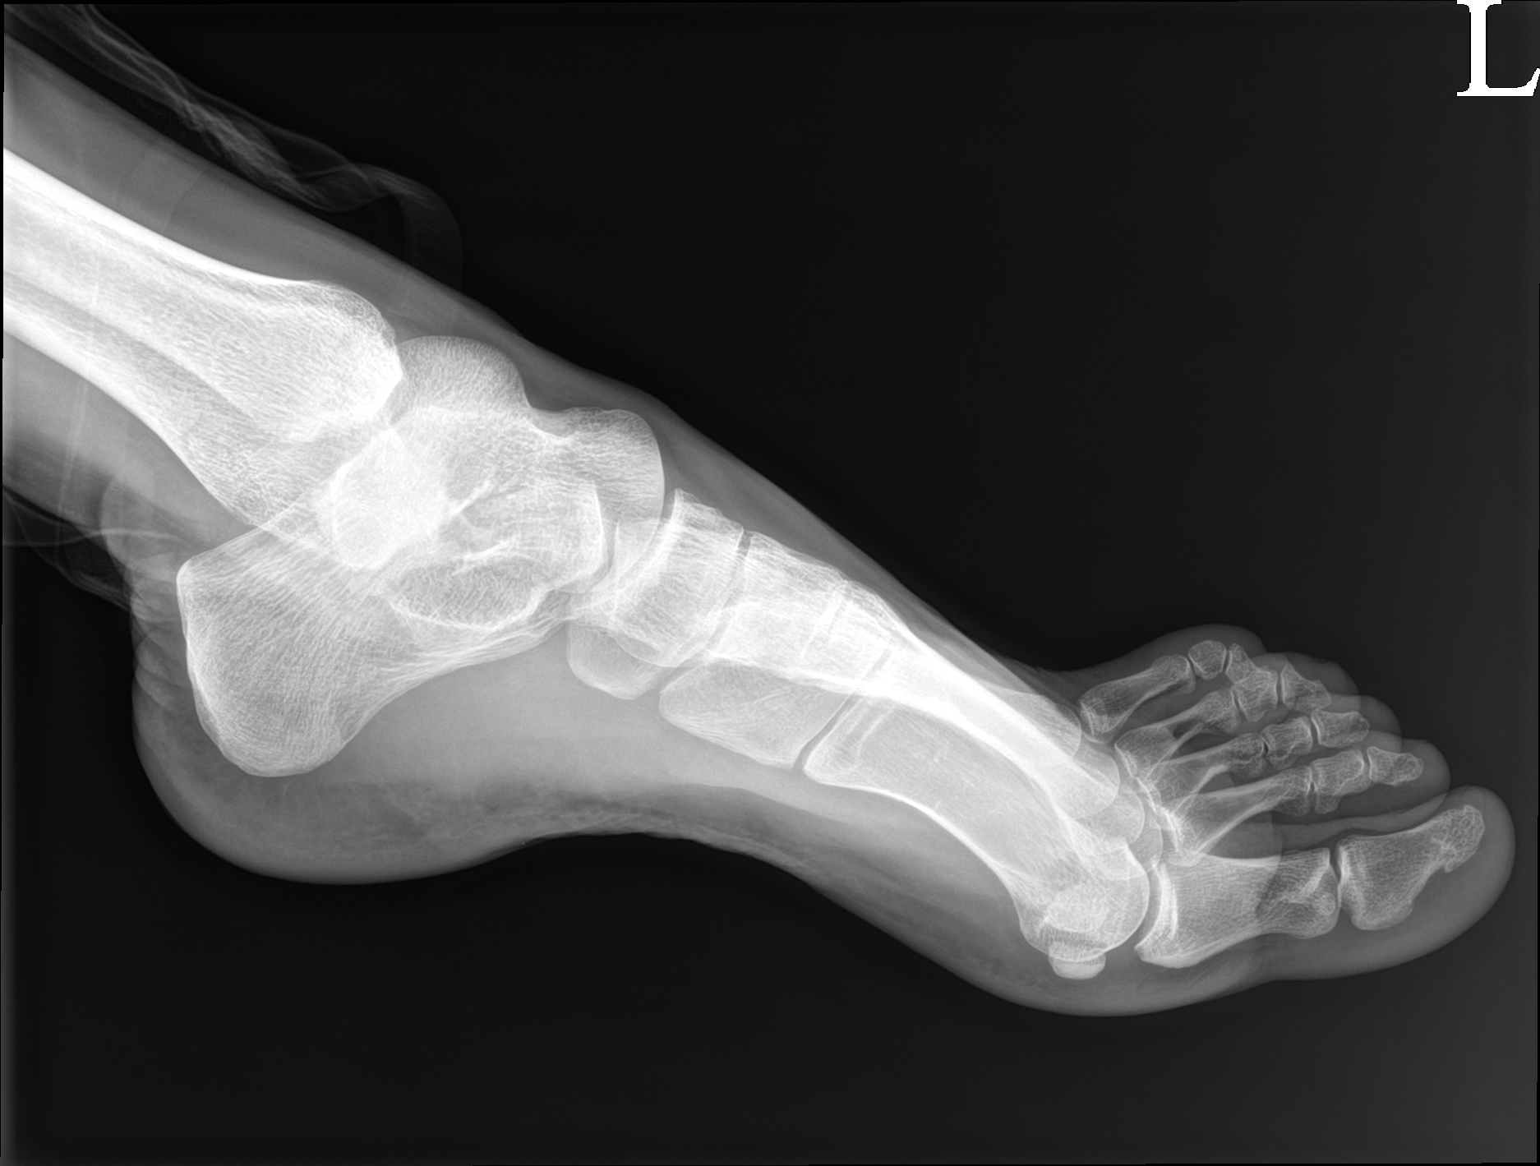

[3 of 3 positions shown; findings below may reference images not displayed]

FINDINGS: Acute nondisplaced intra-articular fracture involving head of the
fifth proximal phalanx. No subluxation. No radiopaque foreign body
IMPRESSION: Acute nondisplaced intra-articular fracture involving the head of
fifth proximal phalanx.

These results will be called to the ordering clinician or
representative by the Radiologist Assistant, and communication
documented in the PACS or [REDACTED].

## 2023-09-02 DIAGNOSIS — K5000 Crohn's disease of small intestine without complications: Secondary | ICD-10-CM | POA: Diagnosis not present

## 2024-02-08 ENCOUNTER — Ambulatory Visit (INDEPENDENT_AMBULATORY_CARE_PROVIDER_SITE_OTHER): Payer: Self-pay | Admitting: Family Medicine

## 2024-02-08 ENCOUNTER — Encounter: Payer: Self-pay | Admitting: Family Medicine

## 2024-02-08 VITALS — BP 126/80 | HR 64 | Resp 18 | Ht 65.0 in | Wt 116.8 lb

## 2024-02-08 DIAGNOSIS — L729 Follicular cyst of the skin and subcutaneous tissue, unspecified: Secondary | ICD-10-CM | POA: Diagnosis not present

## 2024-02-08 NOTE — Progress Notes (Signed)
 Chief Complaint  Patient presents with   skin check    Knot on head - onset 1.5 years    Phillip Mcclain is a 33 y.o. male here for a skin complaint.  Duration: 1.5 years Location: R side of scalp Pruritic? No Painful? Only when pressure applied Drainage? No New soaps/lotions/topicals/detergents? No Trauma? No Other associated symptoms: might be slightly growing Therapies tried thus far: none  Past Medical History:  Diagnosis Date   Crohn's disease (HCC)    Crohn's disease of intestine (HCC) 02/24/2005   Dizzy 09/14/2019   Postconcussion syndrome spring 2021   Post concussion syndrome 07/12/2019   Tinnitus 09/14/2019   Following concussion 2021    BP 126/80   Pulse 64   Resp 18   Ht 5' 5 (1.651 m)   Wt 116 lb 12.8 oz (53 kg)   SpO2 98%   BMI 19.44 kg/m  Gen: awake, alert, appearing stated age Lungs: No accessory muscle use Skin: See below. 0.9 x 0.8 cm. No drainage, erythema, TTP, fluctuance, excoriation Psych: Age appropriate judgment and insight    Cyst of skin - Plan: Ambulatory referral to Dermatology  Refer derm. No emergent tx needed. [place].  F/u prn. The patient voiced understanding and agreement to the plan.  Mabel [place], DO 02/08/24 8:01 AM

## 2024-02-08 NOTE — Patient Instructions (Signed)
 If you do not hear anything about your referral in the next 1-2 weeks, call our office and ask for an update.  Let us know if you need anything.
